# Patient Record
Sex: Male | Born: 1940 | Race: White | Hispanic: No | Marital: Married | Smoking: Former smoker
Health system: Southern US, Community
[De-identification: ages and names within clinical notes are randomized; demographics above are authoritative.]

## PROBLEM LIST (undated history)

## (undated) DIAGNOSIS — R519 Headache, unspecified: Secondary | ICD-10-CM

## (undated) DIAGNOSIS — R0609 Other forms of dyspnea: Secondary | ICD-10-CM

## (undated) DIAGNOSIS — Z87442 Personal history of urinary calculi: Secondary | ICD-10-CM

## (undated) DIAGNOSIS — R51 Headache: Secondary | ICD-10-CM

## (undated) DIAGNOSIS — R0600 Dyspnea, unspecified: Secondary | ICD-10-CM

## (undated) DIAGNOSIS — M1990 Unspecified osteoarthritis, unspecified site: Secondary | ICD-10-CM

## (undated) DIAGNOSIS — N200 Calculus of kidney: Secondary | ICD-10-CM

## (undated) DIAGNOSIS — N400 Enlarged prostate without lower urinary tract symptoms: Secondary | ICD-10-CM

## (undated) DIAGNOSIS — F32A Depression, unspecified: Secondary | ICD-10-CM

## (undated) DIAGNOSIS — E782 Mixed hyperlipidemia: Secondary | ICD-10-CM

## (undated) DIAGNOSIS — F329 Major depressive disorder, single episode, unspecified: Secondary | ICD-10-CM

## (undated) DIAGNOSIS — I10 Essential (primary) hypertension: Secondary | ICD-10-CM

## (undated) DIAGNOSIS — F419 Anxiety disorder, unspecified: Secondary | ICD-10-CM

## (undated) DIAGNOSIS — C801 Malignant (primary) neoplasm, unspecified: Secondary | ICD-10-CM

## (undated) HISTORY — PX: FINGER TENDON REPAIR: SHX1640

## (undated) HISTORY — DX: Calculus of kidney: N20.0

## (undated) HISTORY — DX: Other forms of dyspnea: R06.09

## (undated) HISTORY — DX: Headache: R51

## (undated) HISTORY — DX: Mixed hyperlipidemia: E78.2

## (undated) HISTORY — DX: Dyspnea, unspecified: R06.00

## (undated) HISTORY — DX: Unspecified osteoarthritis, unspecified site: M19.90

## (undated) HISTORY — PX: UMBILICAL HERNIA REPAIR: SHX196

## (undated) HISTORY — DX: Headache, unspecified: R51.9

---

## 1998-09-22 ENCOUNTER — Ambulatory Visit (HOSPITAL_COMMUNITY): Admission: RE | Admit: 1998-09-22 | Discharge: 1998-09-22 | Payer: Self-pay | Admitting: Specialist

## 2016-07-06 ENCOUNTER — Ambulatory Visit (INDEPENDENT_AMBULATORY_CARE_PROVIDER_SITE_OTHER): Payer: Medicare HMO | Admitting: Otolaryngology

## 2016-07-06 DIAGNOSIS — H900 Conductive hearing loss, bilateral: Secondary | ICD-10-CM

## 2016-07-06 DIAGNOSIS — H6123 Impacted cerumen, bilateral: Secondary | ICD-10-CM

## 2017-01-04 ENCOUNTER — Ambulatory Visit (INDEPENDENT_AMBULATORY_CARE_PROVIDER_SITE_OTHER): Payer: Medicare HMO | Admitting: Otolaryngology

## 2017-01-04 DIAGNOSIS — H6123 Impacted cerumen, bilateral: Secondary | ICD-10-CM

## 2017-06-14 ENCOUNTER — Ambulatory Visit (INDEPENDENT_AMBULATORY_CARE_PROVIDER_SITE_OTHER): Payer: Medicare HMO | Admitting: Cardiovascular Disease

## 2017-06-14 ENCOUNTER — Encounter: Payer: Self-pay | Admitting: *Deleted

## 2017-06-14 ENCOUNTER — Encounter: Payer: Self-pay | Admitting: Cardiovascular Disease

## 2017-06-14 VITALS — BP 128/70 | HR 63 | Ht 66.0 in | Wt 211.0 lb

## 2017-06-14 DIAGNOSIS — R079 Chest pain, unspecified: Secondary | ICD-10-CM | POA: Diagnosis not present

## 2017-06-14 DIAGNOSIS — R0609 Other forms of dyspnea: Secondary | ICD-10-CM

## 2017-06-14 DIAGNOSIS — E785 Hyperlipidemia, unspecified: Secondary | ICD-10-CM | POA: Diagnosis not present

## 2017-06-14 DIAGNOSIS — I10 Essential (primary) hypertension: Secondary | ICD-10-CM | POA: Diagnosis not present

## 2017-06-14 NOTE — Progress Notes (Signed)
CARDIOLOGY CONSULT NOTE  Patient ID: Brian Cordova MRN: [id number] DOB/AGE: 1941/03/23 77 y.o.  Admit date: (Not on file) Primary Physician: Deloria Lair., MD Referring Physician: Dr. Scotty Court  Reason for Consultation: Exertional dyspnea  HPI: Brian Cordova is a 76 y.o. male who is being seen today for the evaluation of exertional dyspnea at the request of Deloria Lair., MD.   He has a history of hypertension and hyperlipidemia.  I personally reviewed all relevant documentation, labs, and studies provided by PCP.  Echocardiogram performed at an outside facility on 05/24/17 demonstrated normal left ventricular systolic function and regional wall motion, LVEF 60-65%, mild left atrial dilatation, trivial mitral regurgitation.  ECG performed in the office today which I ordered and personally interpreted demonstrated sinus rhythm with an incomplete right bundle-branch block and no significant ST segment or T-wave abnormalities.  For the past 3 months, he has been experiencing intermittent exertional dyspnea and chest pain. It only occurs with exertion. 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 He denies a decrease in energy levels over the past several months. He denies claudication pain. He quit smoking 35 years ago and used to smoke 2-3 packs per week for about 20 years. He denies orthopnea, leg swelling, and paroxysmal nocturnal dyspnea. He has occasional dizziness but denies syncope. He denies palpitations.       No Known Allergies  Current Outpatient Prescriptions  Medication Sig Dispense Refill  . finasteride (PROSCAR) 5 MG tablet Take 5 mg by mouth daily.    Marland Kitchen losartan (COZAAR) 25 MG tablet Take 25 mg by mouth daily.    . simvastatin (ZOCOR) 40 MG tablet Take 40 mg by mouth daily.     No current facility-administered medications for this visit.     Past Medical  History:  Diagnosis Date  . DOE (dyspnea on exertion)   . Kidney stone   . Mixed hyperlipidemia   . Occipital headache   . Osteoarthritis     Past Surgical History:  Procedure Laterality Date  . UMBILICAL HERNIA REPAIR      Social History   Social History  . Marital status: Married    Spouse name: N/A  . Number of children: N/A  . Years of education: N/A   Occupational History  . Not on file.   Social History Main Topics  . Smoking status: Never Smoker  . Smokeless tobacco: Never Used  . Alcohol use Not on file  . Drug use: Unknown  . Sexual activity: Not on file   Other Topics Concern  . Not on file   Social History Narrative  . No narrative on file     No family history of premature CAD in 1st degree relatives.  Current Meds  Medication Sig  . finasteride (PROSCAR) 5 MG tablet Take 5 mg by mouth daily.  Marland Kitchen losartan (COZAAR) 25 MG tablet Take 25 mg by mouth daily.  . simvastatin (ZOCOR) 40 MG tablet Take 40 mg by mouth daily.      Review of systems complete and found to be negative unless listed above in HPI    Physical exam Blood pressure 128/70, pulse 63, height 5\' 6"  (1.676 m), weight 211 lb (95.7 kg), SpO2 98 %. General: NAD Neck: No JVD, no thyromegaly or thyroid nodule.  Lungs: Clear to auscultation bilaterally with normal respiratory effort. CV: Nondisplaced PMI. Regular rate and  rhythm, normal S1/S2, no S3/S4, no murmur.  No peripheral edema.  No carotid bruit.    Abdomen: Firm, nontender, no distention.  Skin: Intact without lesions or rashes.  Neurologic: Alert and oriented x 3.  Psych: Normal affect. Extremities: No clubbing or cyanosis.  HEENT: Normal.   ECG: Most recent ECG reviewed.   Labs: No results found for: K, BUN, CREATININE, ALT, TSH, HGB   Lipids: No results found for: LDLCALC, LDLDIRECT, CHOL, TRIG, HDL      ASSESSMENT AND PLAN:  1. Exertional dyspnea and chest pain: Risk factors for coronary artery disease include  hypertension and hyperlipidemia. I will proceed with a nuclear myocardial perfusion imaging study to evaluate for ischemic heart disease (exercise Myoview).  2. Hypertension: Controlled. No changes.  3. Hypercholesterolemia: Continue statin therapy.      Disposition: Follow up in 6 weeks.   Signed: Kate Sable, M.D., F.A.C.C.  06/14/2017, 8:46 AM

## 2017-06-14 NOTE — Patient Instructions (Signed)
Medication Instructions:  Continue all current medications.  Labwork: none  Testing/Procedures:  Your physician has requested that you have a lexiscan myoview. For further information please visit [email or web address]. Please follow instruction sheet, as given.  Office will contact with results via phone or letter.    Follow-Up: 6 weeks   Any Other Special Instructions Will Be Listed Below (If Applicable).  If you need a refill on your cardiac medications before your next appointment, please call your pharmacy.  

## 2017-06-25 ENCOUNTER — Encounter (HOSPITAL_COMMUNITY)
Admission: RE | Admit: 2017-06-25 | Discharge: 2017-06-25 | Disposition: A | Discharge status: Home / Self Care | Payer: Medicare HMO | Source: Ambulatory Visit | Attending: Cardiovascular Disease | Admitting: Cardiovascular Disease

## 2017-06-25 ENCOUNTER — Encounter (HOSPITAL_BASED_OUTPATIENT_CLINIC_OR_DEPARTMENT_OTHER)
Admission: RE | Admit: 2017-06-25 | Discharge: 2017-06-25 | Disposition: A | Discharge status: Home / Self Care | Payer: Medicare HMO | Source: Ambulatory Visit | Attending: Cardiovascular Disease | Admitting: Cardiovascular Disease

## 2017-06-25 ENCOUNTER — Encounter (HOSPITAL_COMMUNITY): Payer: Self-pay

## 2017-06-25 DIAGNOSIS — R079 Chest pain, unspecified: Secondary | ICD-10-CM

## 2017-06-25 DIAGNOSIS — R0609 Other forms of dyspnea: Secondary | ICD-10-CM

## 2017-06-25 HISTORY — DX: Essential (primary) hypertension: I10

## 2017-06-25 HISTORY — DX: Malignant (primary) neoplasm, unspecified: C80.1

## 2017-06-25 LAB — NM MYOCAR MULTI W/SPECT W/WALL MOTION / EF
CHL CUP NUCLEAR SSS: 0
CHL RATE OF PERCEIVED EXERTION: 14
CSEPEDS: 10 s
CSEPHR: 89 %
Estimated workload: 6.4 METS
Exercise duration (min): 6 min
LV sys vol: 40 mL
LVDIAVOL: 91 mL (ref 62–150)
MPHR: 146 {beats}/min
Peak HR: 131 {beats}/min
RATE: 0.32
Rest HR: 64 {beats}/min
SDS: 0
SRS: 0
TID: 0.93

## 2017-06-25 MED ORDER — REGADENOSON 0.4 MG/5ML IV SOLN
INTRAVENOUS | Status: AC
  Filled 2017-06-25: qty 5

## 2017-06-25 MED ORDER — SODIUM CHLORIDE 0.9% FLUSH
INTRAVENOUS | Status: AC
  Administered 2017-06-25: 10 mL via INTRAVENOUS
  Filled 2017-06-25: qty 10

## 2017-06-25 MED ORDER — TECHNETIUM TC 99M TETROFOSMIN IV KIT
10.0000 | PACK | Freq: Once | INTRAVENOUS | Status: AC | PRN
  Administered 2017-06-25: 11 via INTRAVENOUS

## 2017-06-25 MED ORDER — TECHNETIUM TC 99M TETROFOSMIN IV KIT
30.0000 | PACK | Freq: Once | INTRAVENOUS | Status: AC | PRN
  Administered 2017-06-25: 32 via INTRAVENOUS

## 2017-06-27 ENCOUNTER — Telehealth: Payer: Self-pay | Admitting: *Deleted

## 2017-06-27 NOTE — Telephone Encounter (Signed)
Notes recorded by Laurine Blazer, LPN on 15/83/0940 at 2:16 PM EDT Patient notified. Copy to pmd. Follow up 07/20/2017. ------  Notes recorded by Herminio Commons, MD on 06/25/2017 at 4:59 PM EDT Low risk. No apparent blockages.

## 2017-07-05 ENCOUNTER — Ambulatory Visit (INDEPENDENT_AMBULATORY_CARE_PROVIDER_SITE_OTHER): Payer: Medicare HMO | Admitting: Otolaryngology

## 2017-07-05 DIAGNOSIS — H6123 Impacted cerumen, bilateral: Secondary | ICD-10-CM

## 2017-07-18 ENCOUNTER — Telehealth: Payer: Self-pay | Admitting: Cardiovascular Disease

## 2017-07-18 NOTE — Telephone Encounter (Signed)
Patient called to cancel his appointment with Dr. Bronson Ing on Friday 07-20-17. States that he does not want to reschedule at this time.

## 2017-07-18 NOTE — Telephone Encounter (Signed)
Will let Dr. Bronson Ing know

## 2017-07-20 ENCOUNTER — Ambulatory Visit: Payer: Medicare HMO | Admitting: Cardiovascular Disease

## 2018-01-03 ENCOUNTER — Ambulatory Visit (INDEPENDENT_AMBULATORY_CARE_PROVIDER_SITE_OTHER): Payer: Medicare HMO | Admitting: Otolaryngology

## 2018-01-03 DIAGNOSIS — H6123 Impacted cerumen, bilateral: Secondary | ICD-10-CM

## 2018-01-11 DIAGNOSIS — N529 Male erectile dysfunction, unspecified: Secondary | ICD-10-CM | POA: Diagnosis not present

## 2018-01-11 DIAGNOSIS — Z6833 Body mass index (BMI) 33.0-33.9, adult: Secondary | ICD-10-CM | POA: Diagnosis not present

## 2018-01-11 DIAGNOSIS — E782 Mixed hyperlipidemia: Secondary | ICD-10-CM | POA: Diagnosis not present

## 2018-01-11 DIAGNOSIS — N400 Enlarged prostate without lower urinary tract symptoms: Secondary | ICD-10-CM | POA: Diagnosis not present

## 2018-01-11 DIAGNOSIS — I10 Essential (primary) hypertension: Secondary | ICD-10-CM | POA: Diagnosis not present

## 2018-01-28 DIAGNOSIS — N529 Male erectile dysfunction, unspecified: Secondary | ICD-10-CM | POA: Diagnosis not present

## 2018-01-30 DIAGNOSIS — Z6832 Body mass index (BMI) 32.0-32.9, adult: Secondary | ICD-10-CM | POA: Diagnosis not present

## 2018-01-30 DIAGNOSIS — F331 Major depressive disorder, recurrent, moderate: Secondary | ICD-10-CM | POA: Diagnosis not present

## 2018-02-28 DIAGNOSIS — N529 Male erectile dysfunction, unspecified: Secondary | ICD-10-CM | POA: Diagnosis not present

## 2018-03-05 DIAGNOSIS — M25521 Pain in right elbow: Secondary | ICD-10-CM | POA: Diagnosis not present

## 2018-03-05 DIAGNOSIS — Z6833 Body mass index (BMI) 33.0-33.9, adult: Secondary | ICD-10-CM | POA: Diagnosis not present

## 2018-03-05 DIAGNOSIS — F331 Major depressive disorder, recurrent, moderate: Secondary | ICD-10-CM | POA: Diagnosis not present

## 2018-03-05 DIAGNOSIS — E782 Mixed hyperlipidemia: Secondary | ICD-10-CM | POA: Diagnosis not present

## 2018-03-05 DIAGNOSIS — I10 Essential (primary) hypertension: Secondary | ICD-10-CM | POA: Diagnosis not present

## 2018-03-05 DIAGNOSIS — N400 Enlarged prostate without lower urinary tract symptoms: Secondary | ICD-10-CM | POA: Diagnosis not present

## 2018-03-05 DIAGNOSIS — N529 Male erectile dysfunction, unspecified: Secondary | ICD-10-CM | POA: Diagnosis not present

## 2018-03-19 DIAGNOSIS — H524 Presbyopia: Secondary | ICD-10-CM | POA: Diagnosis not present

## 2018-03-25 DIAGNOSIS — L821 Other seborrheic keratosis: Secondary | ICD-10-CM | POA: Diagnosis not present

## 2018-03-25 DIAGNOSIS — Z85820 Personal history of malignant melanoma of skin: Secondary | ICD-10-CM | POA: Diagnosis not present

## 2018-03-25 DIAGNOSIS — L570 Actinic keratosis: Secondary | ICD-10-CM | POA: Diagnosis not present

## 2018-03-25 DIAGNOSIS — Z85828 Personal history of other malignant neoplasm of skin: Secondary | ICD-10-CM | POA: Diagnosis not present

## 2018-03-28 DIAGNOSIS — N529 Male erectile dysfunction, unspecified: Secondary | ICD-10-CM | POA: Diagnosis not present

## 2018-04-26 DIAGNOSIS — N529 Male erectile dysfunction, unspecified: Secondary | ICD-10-CM | POA: Diagnosis not present

## 2018-05-15 ENCOUNTER — Ambulatory Visit: Payer: Medicare HMO | Admitting: Urology

## 2018-05-15 DIAGNOSIS — N401 Enlarged prostate with lower urinary tract symptoms: Secondary | ICD-10-CM | POA: Diagnosis not present

## 2018-05-15 DIAGNOSIS — N5201 Erectile dysfunction due to arterial insufficiency: Secondary | ICD-10-CM | POA: Diagnosis not present

## 2018-05-24 DIAGNOSIS — N529 Male erectile dysfunction, unspecified: Secondary | ICD-10-CM | POA: Diagnosis not present

## 2018-05-24 DIAGNOSIS — Z23 Encounter for immunization: Secondary | ICD-10-CM | POA: Diagnosis not present

## 2018-06-11 IMAGING — NM NM MYOCAR MULTI W/SPECT W/WALL MOTION & EF
2 series · 12 of 12 positions shown · non-contrast
Comparison: none

[Series 1: rest · 6.51mm/px · 6 of 64 frames shown]
[frame 6/64]
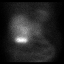
[frame 16/64]
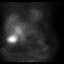
[frame 27/64]
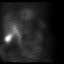
[frame 38/64]
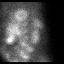
[frame 48/64]
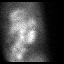
[frame 59/64]
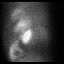

[Series 2: stress gated · 6.51mm/px · 6 of 64 frames shown]
[frame 6/64]
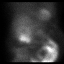
[frame 16/64]
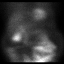
[frame 27/64]
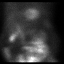
[frame 38/64]
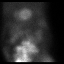
[frame 48/64]
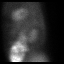
[frame 59/64]
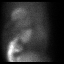

[12 of 12 positions shown; findings below may reference images not displayed]

Canned report from images found in remote index.

Refer to host system for actual result text.

## 2018-06-20 DIAGNOSIS — R350 Frequency of micturition: Secondary | ICD-10-CM | POA: Diagnosis not present

## 2018-06-20 DIAGNOSIS — N401 Enlarged prostate with lower urinary tract symptoms: Secondary | ICD-10-CM | POA: Diagnosis not present

## 2018-06-20 DIAGNOSIS — R3915 Urgency of urination: Secondary | ICD-10-CM | POA: Diagnosis not present

## 2018-06-24 DIAGNOSIS — N529 Male erectile dysfunction, unspecified: Secondary | ICD-10-CM | POA: Diagnosis not present

## 2018-06-26 ENCOUNTER — Ambulatory Visit: Payer: Medicare HMO | Admitting: Urology

## 2018-07-03 ENCOUNTER — Ambulatory Visit: Payer: Medicare HMO | Admitting: Urology

## 2018-07-03 DIAGNOSIS — N401 Enlarged prostate with lower urinary tract symptoms: Secondary | ICD-10-CM | POA: Diagnosis not present

## 2018-07-04 ENCOUNTER — Other Ambulatory Visit: Payer: Self-pay | Admitting: Urology

## 2018-07-04 ENCOUNTER — Ambulatory Visit (INDEPENDENT_AMBULATORY_CARE_PROVIDER_SITE_OTHER): Payer: Medicare HMO | Admitting: Otolaryngology

## 2018-07-04 DIAGNOSIS — H6123 Impacted cerumen, bilateral: Secondary | ICD-10-CM | POA: Diagnosis not present

## 2018-07-18 NOTE — Patient Instructions (Signed)
Brian Cordova  07/18/2018     @PREFPERIOPPHARMACY @   Your procedure is scheduled on  07/29/2018.  Report to [facility] at  645  A.M.  Call this number if you have problems the morning of surgery:  [phone]   Remember:  Do not eat or drink after midnight.                        Take these medicines the morning of surgery with A SIP OF WATER  Proscar, losartan.    Do not wear jewelry, make-up or nail polish.  Do not wear lotions, powders, or perfumes, or deodorant.  Do not shave 48 hours prior to surgery.  Men may shave face and neck.  Do not bring valuables to the hospital.  [facility] is not responsible for any belongings or valuables.  Contacts, dentures or bridgework may not be worn into surgery.  Leave your suitcase in the car.  After surgery it may be brought to your room.  For patients admitted to the hospital, discharge time will be determined by your treatment team.  Patients discharged the day of surgery will not be allowed to drive home.   Name and phone number of your driver:   family Special instructions:  None  Please read over the following fact sheets that you were given. Anesthesia Post-op Instructions and Care and Recovery After Surgery       Transurethral Resection of the Prostate Transurethral resection of the prostate (TURP) is the removal (resection) of part of the gland that produces semen (prostate gland). This procedure is done to treat benign prostatic hyperplasia (BPH). BPH is an abnormal, noncancerous (benign) increase in the number of cells that make up the prostate tissue. BPH causes the prostate to get bigger. The enlarged prostate can push against or block the tube that drains urine from the bladder out of the body (urethra). BPH can affect normal urine flow by causing bladder infections, difficulty controlling bladder function, and difficulty emptying the bladder. The goal of TURP is to remove enough prostate tissue to  allow for a normal flow of urine. In a transurethral resection, a thin telescope with a light, a tiny camera, and an electric cutting edge (resectoscope) is passed through the urethra and into the prostate. The opening of the urethra is at the end of the penis. Tell a health care provider about:  Any allergies you have.  All medicines you are taking, including vitamins, herbs, eye drops, creams, and over-the-counter medicines.  Any problems you or family members have had with anesthetic medicines.  Any blood disorders you have.  Any surgeries you have had.  Any medical conditions you have.  Any prostate infections you have had. What are the risks? Generally, this is a safe procedure. However, problems may occur, including:  Infection.  Bleeding.  Allergic reactions to medicines.  Damage to other structures or organs, such as: ? The urethra. ? The bladder. ? Muscles that surround the prostate.  Difficulty getting an erection.  Difficulty controlling urination (incontinence).  Scarring, which may cause problems with urine flow.  What happens before the procedure?  Follow instructions from your health care provider about eating or drinking restrictions.  Ask your health care provider about: ? Changing or stopping your regular medicines. This is especially important if you are taking diabetes medicines or blood thinners. ? Taking medicines such as aspirin and ibuprofen. These  medicines can thin your blood. Do not take these medicines before your procedure if your health care provider instructs you not to.  You may have a physical exam.  You may have a blood or urine sample taken.  You may be given antibiotic medicine to help prevent infection.  Ask your health care provider how your surgical site will be marked or identified.  Plan to have someone take you home after the procedure. You may not be able to drive for up to 10 days after your procedure. What happens  during the procedure?  To reduce your risk of infection: ? Your health care team will wash or sanitize their hands. ? Your skin will be washed with soap.  An IV tube will be inserted into one of your veins.  You will be given one or more of the following: ? A medicine to help you relax (sedative). ? A medicine to make you fall asleep (general anesthetic). ? A medicine that is injected into your spine to numb the area below and slightly above the injection site (spinal anesthetic).  Your legs will be placed in foot rests (stirrups) so that your legs are apart and your knees are bent.  The resectoscope will be passed through your urethra to your prostate.  Parts of your prostate will be resected using the cutting edge of the resectoscope.  The resectoscope will be removed.  A thin, flexible tube (catheter) will be passed through your urethra and into your bladder. The catheter will drain urine into a bag outside of your body. ? Fluid may be passed through the catheter to keep the catheter open. The procedure may vary among health care providers and hospitals. What happens after the procedure?  Your blood pressure, heart rate, breathing rate, and blood oxygen level will be monitored often until the medicines you were given have worn off.  You may continue to receive fluids and medicines through an IV tube.  You may have some pain. Pain medicine will be available to help you.  You will have a catheter draining your urine. ? You may have blood in your urine. Your catheter may be kept in until your urine is clear. ? Your urinary drainage will be monitored. If necessary, your bladder may be rinsed out (irrigated) through your catheter.  You will be encouraged to walk around as soon as possible.  You may have to wear compression stockings. These stockings help prevent blood clots and reduce swelling in your legs.  Do not drive for 24 hours if you received a sedative. This information  is not intended to replace advice given to you by your health care provider. Make sure you discuss any questions you have with your health care provider. Document Released: 08/28/2005 Document Revised: 04/30/2016 Document Reviewed: 05/20/2015 Elsevier Interactive Patient Education  2018 [employer].  Transurethral Resection of the Prostate, Care After Refer to this sheet in the next few weeks. These instructions provide you with information about caring for yourself after your procedure. Your health care provider may also give you more specific instructions. Your treatment has been planned according to current medical practices, but problems sometimes occur. Call your health care provider if you have any problems or questions after your procedure. What can I expect after the procedure? After the procedure, it is common to have:  Mild pain in your lower abdomen.  Soreness or mild discomfort in your penis from having the catheter inserted during the procedure.  A feeling of urgency when  you need to urinate.  A small amount of blood in your urine. You may notice some small blood clots in your urine. These are normal.  Follow these instructions at home: Medicines   Take over-the-counter and prescription medicines only as told by your health care provider.  Do not drive or operate heavy machinery while taking prescription pain medicine.  Do not drive for 24 hours if you received a sedative.  If you were prescribed antibiotic medicine, take it as told by your health care provider. Do not stop taking the antibiotic even if you start to feel better. Activity  Return to your normal activities as told by your health care provider. Ask your health care provider what activities are safe for you.  Do not lift anything that is heavier than 10 lb (4.5 kg) for 3 weeks after your procedure, or as long as told by your health care provider.  Avoid intense physical activity for as long as told by  your health care provider.  Walk at least one time every day. This helps to prevent blood clots. You may increase your physical activity gradually as you start to feel better. Lifestyle  Do not drink alcohol for as long as told by your health care provider. This is especially important if you are taking prescription pain medicines.  Do not engage in sexual activity until your health care provider says that you can do this. General instructions  Do not take baths, swim, or use a hot tub until your health care provider approves.  Drink enough fluid to keep your urine clear or pale yellow.  Urinate as soon as you feel the need to. Do not try to hold your urine for long periods of time.  If your health care provider approves, you may take a stool softener for 2-3 weeks to prevent you from straining to have a bowel movement.  Wear compression stockings as told by your health care provider. These stockings help to prevent blood clots and reduce swelling in your legs.  Keep all follow-up visits as told by your health care provider. This is important. Contact a health care provider if:  You have difficulty urinating.  You have a fever.  You have pain that gets worse or does not improve with medicine.  You have blood in your urine that does not go away after 1 week of resting and drinking more fluids.  You have swelling in your penis or testicles. Get help right away if:  You are unable to urinate.  You are having more blood clots in your urine instead of fewer.  You have: ? Large blood clots. ? A lot of blood in your urine. ? Pain in your back or lower abdomen. ? Pain or swelling in your legs. ? Chills and you are shaking. This information is not intended to replace advice given to you by your health care provider. Make sure you discuss any questions you have with your health care provider. Document Released: 08/28/2005 Document Revised: 04/30/2016 Document Reviewed:  05/20/2015 Elsevier Interactive Patient Education  2017 [place] Anesthesia, Adult General anesthesia is the use of medicines to make a person "go to sleep" (be unconscious) for a medical procedure. General anesthesia is often recommended when a procedure:  Is long.  Requires you to be still or in an unusual position.  Is major and can cause you to lose blood.  Is impossible to do without general anesthesia.  The medicines used for general anesthesia are  called general anesthetics. In addition to making you sleep, the medicines:  Prevent pain.  Control your blood pressure.  Relax your muscles.  Tell a health care provider about:  Any allergies you have.  All medicines you are taking, including vitamins, herbs, eye drops, creams, and over-the-counter medicines.  Any problems you or family members have had with anesthetic medicines.  Types of anesthetics you have had in the past.  Any bleeding disorders you have.  Any surgeries you have had.  Any medical conditions you have.  Any history of heart or lung conditions, such as heart failure, sleep apnea, or chronic obstructive pulmonary disease (COPD).  Whether you are pregnant or may be pregnant.  Whether you use tobacco, alcohol, marijuana, or street drugs.  Any history of Armed forces logistics/support/administrative officer.  Any history of depression or anxiety. What are the risks? Generally, this is a safe procedure. However, problems may occur, including:  Allergic reaction to anesthetics.  Lung and heart problems.  Inhaling food or liquids from your stomach into your lungs (aspiration).  Injury to nerves.  Waking up during your procedure and being unable to move (rare).  Extreme agitation or a state of mental confusion (delirium) when you wake up from the anesthetic.  Air in the bloodstream, which can lead to stroke.  These problems are more likely to develop if you are having a major surgery or if you have an advanced  medical condition. You can prevent some of these complications by answering all of your health care provider's questions thoroughly and by following all pre-procedure instructions. General anesthesia can cause side effects, including:  Nausea or vomiting  A sore throat from the breathing tube.  Feeling cold or shivery.  Feeling tired, washed out, or achy.  Sleepiness or drowsiness.  Confusion or agitation.  What happens before the procedure? Staying hydrated Follow instructions from your health care provider about hydration, which may include:  Up to 2 hours before the procedure - you may continue to drink clear liquids, such as water, clear fruit juice, black coffee, and plain tea.  Eating and drinking restrictions Follow instructions from your health care provider about eating and drinking, which may include:  8 hours before the procedure - stop eating heavy meals or foods such as meat, fried foods, or fatty foods.  6 hours before the procedure - stop eating light meals or foods, such as toast or cereal.  6 hours before the procedure - stop drinking milk or drinks that contain milk.  2 hours before the procedure - stop drinking clear liquids.  Medicines  Ask your health care provider about: ? Changing or stopping your regular medicines. This is especially important if you are taking diabetes medicines or blood thinners. ? Taking medicines such as aspirin and ibuprofen. These medicines can thin your blood. Do not take these medicines before your procedure if your health care provider instructs you not to. ? Taking new dietary supplements or medicines. Do not take these during the week before your procedure unless your health care provider approves them.  If you are told to take a medicine or to continue taking a medicine on the day of the procedure, take the medicine with sips of water. General instructions   Ask if you will be going home the same day, the following day,  or after a longer hospital stay. ? Plan to have someone take you home. ? Plan to have someone stay with you for the first 24 hours after you leave the  hospital or clinic.  For 3-6 weeks before the procedure, try not to use any tobacco products, such as cigarettes, chewing tobacco, and e-cigarettes.  You may brush your teeth on the morning of the procedure, but make sure to spit out the toothpaste. What happens during the procedure?  You will be given anesthetics through a mask and through an IV tube in one of your veins.  You may receive medicine to help you relax (sedative).  As soon as you are asleep, a breathing tube may be used to help you breathe.  An anesthesia specialist will stay with you throughout the procedure. He or she will help keep you comfortable and safe by continuing to give you medicines and adjusting the amount of medicine that you get. He or she will also watch your blood pressure, pulse, and oxygen levels to make sure that the anesthetics do not cause any problems.  If a breathing tube was used to help you breathe, it will be removed before you wake up. The procedure may vary among health care providers and hospitals. What happens after the procedure?  You will wake up, often slowly, after the procedure is complete, usually in a recovery area.  Your blood pressure, heart rate, breathing rate, and blood oxygen level will be monitored until the medicines you were given have worn off.  You may be given medicine to help you calm down if you feel anxious or agitated.  If you will be going home the same day, your health care provider may check to make sure you can stand, drink, and urinate.  Your health care providers will treat your pain and side effects before you go home.  Do not drive for 24 hours if you received a sedative.  You may: ? Feel nauseous and vomit. ? Have a sore throat. ? Have mental slowness. ? Feel cold or shivery. ? Feel sleepy. ? Feel  tired. ? Feel sore or achy, even in parts of your body where you did not have surgery. This information is not intended to replace advice given to you by your health care provider. Make sure you discuss any questions you have with your health care provider. Document Released: 12/05/2007 Document Revised: 02/08/2016 Document Reviewed: 08/12/2015 Elsevier Interactive Patient Education  2018 [place] Anesthesia, Adult, Care After These instructions provide you with information about caring for yourself after your procedure. Your health care provider may also give you more specific instructions. Your treatment has been planned according to current medical practices, but problems sometimes occur. Call your health care provider if you have any problems or questions after your procedure. What can I expect after the procedure? After the procedure, it is common to have:  Vomiting.  A sore throat.  Mental slowness.  It is common to feel:  Nauseous.  Cold or shivery.  Sleepy.  Tired.  Sore or achy, even in parts of your body where you did not have surgery.  Follow these instructions at home: For at least 24 hours after the procedure:  Do not: ? Participate in activities where you could fall or become injured. ? Drive. ? Use heavy machinery. ? Drink alcohol. ? Take sleeping pills or medicines that cause drowsiness. ? Make important decisions or sign legal documents. ? Take care of children on your own.  Rest. Eating and drinking  If you vomit, drink water, juice, or soup when you can drink without vomiting.  Drink enough fluid to keep your urine clear or pale yellow.  Make sure you have little or no nausea before eating solid foods.  Follow the diet recommended by your health care provider. General instructions  Have a responsible adult stay with you until you are awake and alert.  Return to your normal activities as told by your health care provider. Ask your  health care provider what activities are safe for you.  Take over-the-counter and prescription medicines only as told by your health care provider.  If you smoke, do not smoke without supervision.  Keep all follow-up visits as told by your health care provider. This is important. Contact a health care provider if:  You continue to have nausea or vomiting at home, and medicines are not helpful.  You cannot drink fluids or start eating again.  You cannot urinate after 8-12 hours.  You develop a skin rash.  You have fever.  You have increasing redness at the site of your procedure. Get help right away if:  You have difficulty breathing.  You have chest pain.  You have unexpected bleeding.  You feel that you are having a life-threatening or urgent problem. This information is not intended to replace advice given to you by your health care provider. Make sure you discuss any questions you have with your health care provider. Document Released: 12/04/2000 Document Revised: 01/31/2016 Document Reviewed: 08/12/2015 Elsevier Interactive Patient Education  [employer].

## 2018-07-24 ENCOUNTER — Encounter (HOSPITAL_COMMUNITY): Payer: Self-pay

## 2018-07-24 ENCOUNTER — Other Ambulatory Visit: Payer: Self-pay

## 2018-07-24 ENCOUNTER — Encounter (HOSPITAL_COMMUNITY)
Admission: RE | Admit: 2018-07-24 | Discharge: 2018-07-24 | Disposition: A | Discharge status: Home / Self Care | Payer: Medicare HMO | Source: Ambulatory Visit | Attending: Urology | Admitting: Urology

## 2018-07-24 DIAGNOSIS — Z01818 Encounter for other preprocedural examination: Secondary | ICD-10-CM | POA: Diagnosis not present

## 2018-07-24 HISTORY — DX: Personal history of urinary calculi: Z87.442

## 2018-07-24 HISTORY — DX: Anxiety disorder, unspecified: F41.9

## 2018-07-24 HISTORY — DX: Depression, unspecified: F32.A

## 2018-07-24 HISTORY — DX: Major depressive disorder, single episode, unspecified: F32.9

## 2018-07-24 HISTORY — DX: Benign prostatic hyperplasia without lower urinary tract symptoms: N40.0

## 2018-07-24 LAB — CBC WITH DIFFERENTIAL/PLATELET
Abs Immature Granulocytes: 0.03 10*3/uL (ref 0.00–0.07)
BASOS ABS: 0.1 10*3/uL (ref 0.0–0.1)
Basophils Relative: 1 %
Eosinophils Absolute: 0.4 10*3/uL (ref 0.0–0.5)
Eosinophils Relative: 4 %
HEMATOCRIT: 45.3 % (ref 39.0–52.0)
HEMOGLOBIN: 14.7 g/dL (ref 13.0–17.0)
IMMATURE GRANULOCYTES: 0 %
LYMPHS PCT: 34 %
Lymphs Abs: 3.5 10*3/uL (ref 0.7–4.0)
MCH: 31.7 pg (ref 26.0–34.0)
MCHC: 32.5 g/dL (ref 30.0–36.0)
MCV: 97.8 fL (ref 80.0–100.0)
MONOS PCT: 12 %
Monocytes Absolute: 1.2 10*3/uL — ABNORMAL HIGH (ref 0.1–1.0)
NEUTROS PCT: 49 %
Neutro Abs: 5 10*3/uL (ref 1.7–7.7)
Platelets: 267 10*3/uL (ref 150–400)
RBC: 4.63 MIL/uL (ref 4.22–5.81)
RDW: 12.6 % (ref 11.5–15.5)
WBC: 10.1 10*3/uL (ref 4.0–10.5)
nRBC: 0 % (ref 0.0–0.2)

## 2018-07-24 LAB — BASIC METABOLIC PANEL
Anion gap: 8 (ref 5–15)
BUN: 17 mg/dL (ref 8–23)
CHLORIDE: 105 mmol/L (ref 98–111)
CO2: 25 mmol/L (ref 22–32)
Calcium: 8.6 mg/dL — ABNORMAL LOW (ref 8.9–10.3)
Creatinine, Ser: 1.06 mg/dL (ref 0.61–1.24)
GFR calc non Af Amer: >60 mL/min (ref >60)
Glucose, Bld: 127 mg/dL — ABNORMAL HIGH (ref 70–99)
POTASSIUM: 4.3 mmol/L (ref 3.5–5.1)
Sodium: 138 mmol/L (ref 135–145)

## 2018-07-29 ENCOUNTER — Encounter (HOSPITAL_COMMUNITY)
Admission: RE | Disposition: A | Discharge status: Home / Self Care | Payer: Self-pay | Source: Ambulatory Visit | Attending: Urology

## 2018-07-29 ENCOUNTER — Ambulatory Visit (HOSPITAL_COMMUNITY): Payer: Medicare HMO | Admitting: Anesthesiology

## 2018-07-29 ENCOUNTER — Ambulatory Visit (HOSPITAL_COMMUNITY)
Admission: RE | Admit: 2018-07-29 | Discharge: 2018-07-29 | Disposition: A | Discharge status: Home / Self Care | Payer: Medicare HMO | Source: Ambulatory Visit | Attending: Urology | Admitting: Urology

## 2018-07-29 ENCOUNTER — Encounter (HOSPITAL_COMMUNITY): Payer: Self-pay | Admitting: *Deleted

## 2018-07-29 ENCOUNTER — Other Ambulatory Visit: Payer: Self-pay

## 2018-07-29 DIAGNOSIS — R3911 Hesitancy of micturition: Secondary | ICD-10-CM | POA: Diagnosis not present

## 2018-07-29 DIAGNOSIS — Z87891 Personal history of nicotine dependence: Secondary | ICD-10-CM | POA: Insufficient documentation

## 2018-07-29 DIAGNOSIS — I10 Essential (primary) hypertension: Secondary | ICD-10-CM | POA: Insufficient documentation

## 2018-07-29 DIAGNOSIS — N401 Enlarged prostate with lower urinary tract symptoms: Secondary | ICD-10-CM | POA: Diagnosis not present

## 2018-07-29 DIAGNOSIS — Z85828 Personal history of other malignant neoplasm of skin: Secondary | ICD-10-CM | POA: Diagnosis not present

## 2018-07-29 DIAGNOSIS — N400 Enlarged prostate without lower urinary tract symptoms: Secondary | ICD-10-CM | POA: Diagnosis not present

## 2018-07-29 DIAGNOSIS — N138 Other obstructive and reflux uropathy: Secondary | ICD-10-CM | POA: Diagnosis not present

## 2018-07-29 HISTORY — PX: TRANSURETHRAL RESECTION OF PROSTATE: SHX73

## 2018-07-29 SURGERY — TURP (TRANSURETHRAL RESECTION OF PROSTATE)
Anesthesia: General | Site: Prostate

## 2018-07-29 MED ORDER — LACTATED RINGERS IV SOLN
INTRAVENOUS | Status: DC
  Administered 2018-07-29 (×2): via INTRAVENOUS

## 2018-07-29 MED ORDER — SODIUM CHLORIDE 0.9 % IR SOLN
Status: DC | PRN
  Administered 2018-07-29 (×4): 3000 mL via INTRAVESICAL

## 2018-07-29 MED ORDER — ONDANSETRON HCL 4 MG/2ML IJ SOLN
INTRAMUSCULAR | Status: AC
  Filled 2018-07-29: qty 2

## 2018-07-29 MED ORDER — FENTANYL CITRATE (PF) 100 MCG/2ML IJ SOLN
INTRAMUSCULAR | Status: DC | PRN
  Administered 2018-07-29: 25 ug via INTRAVENOUS

## 2018-07-29 MED ORDER — WATER FOR IRRIGATION, STERILE IR SOLN
Status: DC | PRN
  Administered 2018-07-29: 1000 mL

## 2018-07-29 MED ORDER — MIDAZOLAM HCL 5 MG/5ML IJ SOLN
INTRAMUSCULAR | Status: DC | PRN
  Administered 2018-07-29: 2 mg via INTRAVENOUS

## 2018-07-29 MED ORDER — PROMETHAZINE HCL 25 MG/ML IJ SOLN: 6.2500 mg | INTRAMUSCULAR | Status: DC | PRN

## 2018-07-29 MED ORDER — PROPOFOL 10 MG/ML IV BOLUS
INTRAVENOUS | Status: AC
  Filled 2018-07-29: qty 40

## 2018-07-29 MED ORDER — ONDANSETRON HCL 4 MG/2ML IJ SOLN
INTRAMUSCULAR | Status: DC | PRN
  Administered 2018-07-29: 4 mg via INTRAVENOUS

## 2018-07-29 MED ORDER — HYDROMORPHONE HCL 1 MG/ML IJ SOLN
INTRAMUSCULAR | Status: AC
  Filled 2018-07-29: qty 0.5

## 2018-07-29 MED ORDER — OXYCODONE-ACETAMINOPHEN 5-325 MG PO TABS: 1.0000 | ORAL_TABLET | ORAL | 0 refills | Status: AC | PRN

## 2018-07-29 MED ORDER — SODIUM CHLORIDE 0.9 % IV SOLN
2.0000 g | INTRAVENOUS | Status: AC
  Administered 2018-07-29: 2 g via INTRAVENOUS

## 2018-07-29 MED ORDER — FENTANYL CITRATE (PF) 100 MCG/2ML IJ SOLN
INTRAMUSCULAR | Status: AC
  Filled 2018-07-29: qty 2

## 2018-07-29 MED ORDER — LIDOCAINE HCL 1 % IJ SOLN
INTRAMUSCULAR | Status: DC | PRN
  Administered 2018-07-29: 60 mg via INTRADERMAL

## 2018-07-29 MED ORDER — PHENYLEPHRINE HCL 10 MG/ML IJ SOLN
INTRAMUSCULAR | Status: DC | PRN
  Administered 2018-07-29: 40 ug via INTRAVENOUS

## 2018-07-29 MED ORDER — MIDAZOLAM HCL 2 MG/2ML IJ SOLN
INTRAMUSCULAR | Status: AC
  Filled 2018-07-29: qty 2

## 2018-07-29 MED ORDER — HYDROCODONE-ACETAMINOPHEN 7.5-325 MG PO TABS: 1.0000 | ORAL_TABLET | Freq: Once | ORAL | Status: DC | PRN

## 2018-07-29 MED ORDER — MIDAZOLAM HCL 2 MG/2ML IJ SOLN: 0.5000 mg | Freq: Once | INTRAMUSCULAR | Status: DC | PRN

## 2018-07-29 MED ORDER — PROPOFOL 10 MG/ML IV BOLUS
INTRAVENOUS | Status: DC | PRN
  Administered 2018-07-29: 150 mg via INTRAVENOUS
  Administered 2018-07-29: 20 mg via INTRAVENOUS

## 2018-07-29 MED ORDER — SODIUM CHLORIDE 0.9 % IV SOLN
INTRAVENOUS | Status: AC
  Filled 2018-07-29: qty 20

## 2018-07-29 MED ORDER — HYDROMORPHONE HCL 1 MG/ML IJ SOLN
0.2500 mg | INTRAMUSCULAR | Status: DC | PRN
  Administered 2018-07-29 (×3): 0.5 mg via INTRAVENOUS
  Filled 2018-07-29 (×2): qty 0.5

## 2018-07-29 SURGICAL SUPPLY — 24 items
BAG DRAIN URO TABLE W/ADPT NS (BAG) ×3 IMPLANT
BAG DRN 8 ADPR NS SKTRN CSTL (BAG) ×1
BAG DRN URN TUBE DRIP CHMBR (OSTOMY) ×1
BAG URINE DRAIN TURP 4L (OSTOMY) ×3 IMPLANT
CATH FOLEY 3WAY 30CC 22F (CATHETERS) ×2 IMPLANT
CLOTH BEACON ORANGE TIMEOUT ST (SAFETY) ×3 IMPLANT
ELECT REM PT RETURN 9FT ADLT (ELECTROSURGICAL) ×3
ELECTRODE REM PT RTRN 9FT ADLT (ELECTROSURGICAL) ×1 IMPLANT
GLOVE BIO SURGEON STRL SZ7 (GLOVE) ×2 IMPLANT
GLOVE BIO SURGEON STRL SZ8 (GLOVE) ×3 IMPLANT
GLOVE BIOGEL PI IND STRL 7.0 (GLOVE) IMPLANT
GLOVE BIOGEL PI INDICATOR 7.0 (GLOVE) ×4
GOWN STRL REUS W/ TWL XL LVL3 (GOWN DISPOSABLE) ×1 IMPLANT
GOWN STRL REUS W/TWL LRG LVL3 (GOWN DISPOSABLE) ×3 IMPLANT
GOWN STRL REUS W/TWL XL LVL3 (GOWN DISPOSABLE) ×3
IV NS IRRIG 3000ML ARTHROMATIC (IV SOLUTION) ×14 IMPLANT
KIT TURNOVER CYSTO (KITS) ×3 IMPLANT
LOOP CUT BIPOLAR 24F LRG (ELECTROSURGICAL) ×3 IMPLANT
MANIFOLD NEPTUNE II (INSTRUMENTS) ×2 IMPLANT
PACK CYSTO (CUSTOM PROCEDURE TRAY) ×3 IMPLANT
PAD ARMBOARD 7.5X6 YLW CONV (MISCELLANEOUS) ×3 IMPLANT
SYR 30ML LL (SYRINGE) ×2 IMPLANT
SYRINGE IRR TOOMEY STRL 70CC (SYRINGE) ×2 IMPLANT
WATER STERILE IRR 1000ML POUR (IV SOLUTION) ×3 IMPLANT

## 2018-07-29 NOTE — Anesthesia Procedure Notes (Signed)
Procedure Name: LMA Insertion Date/Time: 07/29/2018 8:16 AM Performed by: Charmaine Downs, CRNA Pre-anesthesia Checklist: Patient identified, Patient being monitored, Emergency Drugs available, Timeout performed and Suction available Patient Re-evaluated:Patient Re-evaluated prior to induction Oxygen Delivery Method: Circle System Utilized Preoxygenation: Pre-oxygenation with 100% oxygen Induction Type: IV induction Ventilation: Mask ventilation without difficulty LMA: LMA inserted LMA Size: 4.0 Number of attempts: 1 Placement Confirmation: positive ETCO2 and breath sounds checked- equal and bilateral Tube secured with: Tape Dental Injury: Teeth and Oropharynx as per pre-operative assessment

## 2018-07-29 NOTE — Progress Notes (Signed)
[facility]BI  D/C as ordered. Foley draining clear urine.

## 2018-07-29 NOTE — Progress Notes (Signed)
Dr Alyson Ingles at bedside to check pt. Foley draining clear light yellow urine. Order given to d/c pt home.

## 2018-07-29 NOTE — Op Note (Addendum)
Preoperative diagnosis: BPH  Postoperative diagnosis: BPH  Procedure: 1 cystoscopy 2. Transurethral resection of the prostate  Attending: [name]  Anesthesia: General  Estimated blood loss: Minimal  Drains: 22 French foley  Specimens: 1. Prostate Chips  Antibiotics: Rocephin  Findings: Trilobar prostate enlargement. Significant residual prostate tissue. Ureteral orifices in normal anatomic location.   Indications: Patient is a 77 year old male with a history of BPH and elevated PVR after TURP. He underwent ofice cystoscopy which showed a large amount of obstructing residual prostate tissue.  After discussing treatment options, they decided proceed with transurethral resection of the prostate.  Procedure her in detail: The patient was brought to the operating room and a brief timeout was done to ensure correct patient, correct procedure, correct site.  General anesthesia was administered patient was placed in dorsal lithotomy position.  Their genitalia was then prepped and draped in usual sterile fashion.  A rigid 27 French cystoscope was passed in the urethra and the bladder.  Bladder was inspected and we noted no masses or lesions.  the ureteral orifices were in the normal orthotopic locations. removed the cystoscope and placed a resectoscope into the bladder. We then turned our attention to the prostate resection. Using the bipolar resectoscope we resected the median lobe first from the bladder neck to the verumontanum. We then started at the 12 oclock position on the left lobe and resection to the 6 o'clock position from the bladder neck to the verumontanum. We then did the same resection of the right lobe. Once the resection was complete we then cauterized individual bleeders. We then removed the prostate chips and sent them for pathology.  We then re-inspected the prostatic fossa and found no residual bleeding.  the bladder was then drained, a 22 French foley was placed and this  concluded the procedure which was well tolerated by patient.  Complications: None  Condition: Stable, extubated, transferred to PACU  Plan: Patient is to be discharged home and followup in 5 days for foley catheter removal and pathology discussion.

## 2018-07-29 NOTE — Anesthesia Postprocedure Evaluation (Signed)
Anesthesia Post Note  Patient: Brian Cordova  Procedure(s) Performed: TRANSURETHRAL RESECTION OF THE PROSTATE (TURP) (N/A Prostate)  Patient location during evaluation: PACU Anesthesia Type: General Level of consciousness: awake and patient cooperative Pain management: pain level controlled Vital Signs Assessment: post-procedure vital signs reviewed and stable Respiratory status: spontaneous breathing, nonlabored ventilation and respiratory function stable Cardiovascular status: blood pressure returned to baseline Postop Assessment: no apparent nausea or vomiting Anesthetic complications: no     Last Vitals:  Vitals:   07/29/18 0917 07/29/18 0930  BP: (!) 155/94 (!) 151/95  Pulse: 67 (!) 58  Resp: 16 12  Temp: 36.7 C   SpO2: 95% 97%    Last Pain:  Vitals:   07/29/18 0917  TempSrc:   PainSc: (P) 2                  [name] J

## 2018-07-29 NOTE — Anesthesia Preprocedure Evaluation (Signed)
Anesthesia Evaluation    Airway Mallampati: II  TM Distance: >3 FB Neck ROM: Full    Dental no notable dental hx. (+) Poor Dentition, Missing   Pulmonary former smoker,    Pulmonary exam normal breath sounds clear to auscultation       Cardiovascular Exercise Tolerance: Good hypertension, Pt. on medications + DOE  Normal cardiovascular examII Rhythm:Regular Rate:Normal     Neuro/Psych  Headaches, Anxiety Depression    GI/Hepatic   Endo/Other    Renal/GU Renal disease     Musculoskeletal  (+) Arthritis , Osteoarthritis,    Abdominal   Peds  Hematology   Anesthesia Other Findings   Reproductive/Obstetrics                             Anesthesia Physical Anesthesia Plan  ASA: II  Anesthesia Plan: General   Post-op Pain Management:    Induction: Intravenous  PONV Risk Score and Plan:   Airway Management Planned: LMA  Additional Equipment:   Intra-op Plan:   Post-operative Plan: Extubation in OR  Informed Consent: I have reviewed the patients History and Physical, chart, labs and discussed the procedure including the risks, benefits and alternatives for the proposed anesthesia with the patient or authorized representative who has indicated his/her understanding and acceptance.   Dental advisory given  Plan Discussed with: CRNA  Anesthesia Plan Comments: (LMA vs ETT)        Anesthesia Quick Evaluation

## 2018-07-29 NOTE — H&P (Signed)
Urology Admission H&P  Chief Complaint: urinary hesitancy  History of Present Illness: Brian Cordova is a 77yo with a hx of BPH who has persistent severe LUTS after TURP. It has been refractory to medical therapy. Office cystoscopy shows residual prostate tissue that is obstructing. No fevers/chills/sweats  Past Medical History:  Diagnosis Date  . Anxiety   . BPH (benign prostatic hyperplasia)   . Cancer (HCC)    Skin  . Depression   . DOE (dyspnea on exertion)   . History of kidney stones   . Hypertension   . Kidney stone   . Mixed hyperlipidemia   . Occipital headache   . Osteoarthritis    Past Surgical History:  Procedure Laterality Date  . FINGER TENDON REPAIR Left    ring and pinky finger  . UMBILICAL HERNIA REPAIR      Home Medications:  Current Facility-Administered Medications  Medication Dose Route Frequency Provider Last Rate Last Dose  . cefTRIAXone (ROCEPHIN) 2 g in sodium chloride 0.9 % 100 mL IVPB  2 g Intravenous 30 min Pre-Op Carsen Leaf, Candee Furbish, MD      . lactated ringers infusion   Intravenous Continuous Hilaria Ota Talbert Forest, MD       Allergies: No Known Allergies  Family History  Problem Relation Age of Onset  . Pancreatic cancer Mother   . Heart disease Father    Social History:  reports that he quit smoking about 44 years ago. His smoking use included cigarettes. He has a 10.00 pack-year smoking history. He has never used smokeless tobacco. He reports that he drank alcohol. He reports that he does not use drugs.  Review of Systems  Genitourinary: Positive for frequency and urgency.  All other systems reviewed and are negative.   Physical Exam:  Vital signs in last 24 hours: Temp:  [97.8 F (36.6 C)] 97.8 F (36.6 C) (11/18 0715) Pulse Rate:  [69] 69 (11/18 0715) Resp:  [15] 15 (11/18 0715) BP: (167)/(93) 167/93 (11/18 0715) SpO2:  [98 %] 98 % (11/18 0715) Weight:  [97.5 kg] 97.5 kg (11/18 0715) Physical Exam  Constitutional: He is oriented to  person, place, and time. He appears well-developed and well-nourished.  HENT:  Head: Normocephalic and atraumatic.  Eyes: Pupils are equal, round, and reactive to light. EOM are normal.  Neck: Normal range of motion. No thyromegaly present.  Cardiovascular: Normal rate and regular rhythm.  Respiratory: Effort normal. No respiratory distress.  GI: Soft. He exhibits no distension.  Musculoskeletal: Normal range of motion. He exhibits no edema.  Neurological: He is alert and oriented to person, place, and time.  Skin: Skin is warm and dry.  Psychiatric: He has a normal mood and affect. His behavior is normal. Judgment and thought content normal.    Laboratory Data:  No results found for this or any previous visit (from the past 24 hour(s)). No results found for this or any previous visit (from the past 240 hour(s)). Creatinine: Recent Labs    07/24/18 0821  CREATININE 1.06   Baseline Creatinine: 1  Impression/Assessment:  77yo with LUTS with urinary hesitancy  Plan:  The risks/benefits/alternatives to TURP was explained to the patient and he understands and wishes to proceed with surgery  [name] 07/29/2018, 8:01 AM

## 2018-07-29 NOTE — Transfer of Care (Signed)
Immediate Anesthesia Transfer of Care Note  Patient: Brian Cordova  Procedure(s) Performed: TRANSURETHRAL RESECTION OF THE PROSTATE (TURP) (N/A Prostate)  Patient Location: PACU  Anesthesia Type:General  Level of Consciousness: drowsy  Airway & Oxygen Therapy: Patient Spontanous Breathing and Patient connected to nasal cannula oxygen  Post-op Assessment: Report given to RN, Post -op Vital signs reviewed and stable and Patient moving all extremities  Post vital signs: Reviewed and stable  Last Vitals:  Vitals Value Taken Time  BP 155/94 07/29/2018  9:17 AM  Temp    Pulse 66 07/29/2018  9:18 AM  Resp 16 07/29/2018  9:18 AM  SpO2 96 % 07/29/2018  9:18 AM  Vitals shown include unvalidated device data.  Last Pain:  Vitals:   07/29/18 0715  TempSrc: Oral  PainSc: 0-No pain      Patients Stated Pain Goal: 7 (67/34/19 3790)  Complications: No apparent anesthesia complications

## 2018-07-29 NOTE — Discharge Instructions (Signed)
Transurethral Resection of the Prostate, Care After °Refer to this sheet in the next few weeks. These instructions provide you with information about caring for yourself after your procedure. Your health care provider may also give you more specific instructions. Your treatment has been planned according to current medical practices, but problems sometimes occur. Call your health care provider if you have any problems or questions after your procedure. °What can I expect after the procedure? °After the procedure, it is common to have: °· Mild pain in your lower abdomen. °· Soreness or mild discomfort in your penis from having the catheter inserted during the procedure. °· A feeling of urgency when you need to urinate. °· A small amount of blood in your urine. You may notice some small blood clots in your urine. These are normal. ° °Follow these instructions at home: °Medicines ° °· Take over-the-counter and prescription medicines only as told by your health care provider. °· Do not drive or operate heavy machinery while taking prescription pain medicine. °· Do not drive for 24 hours if you received a sedative. °· If you were prescribed antibiotic medicine, take it as told by your health care provider. Do not stop taking the antibiotic even if you start to feel better. °Activity °· Return to your normal activities as told by your health care provider. Ask your health care provider what activities are safe for you. °· Do not lift anything that is heavier than 10 lb (4.5 kg) for 3 weeks after your procedure, or as long as told by your health care provider. °· Avoid intense physical activity for as long as told by your health care provider. °· Walk at least one time every day. This helps to prevent blood clots. You may increase your physical activity gradually as you start to feel better. °Lifestyle °· Do not drink alcohol for as long as told by your health care provider. This is especially important if you are taking  prescription pain medicines. °· Do not engage in sexual activity until your health care provider says that you can do this. °General instructions °· Do not take baths, swim, or use a hot tub until your health care provider approves. °· Drink enough fluid to keep your urine clear or pale yellow. °· Urinate as soon as you feel the need to. Do not try to hold your urine for long periods of time. °· If your health care provider approves, you may take a stool softener for 2-3 weeks to prevent you from straining to have a bowel movement. °· Wear compression stockings as told by your health care provider. These stockings help to prevent blood clots and reduce swelling in your legs. °· Keep all follow-up visits as told by your health care provider. This is important. °Contact a health care provider if: °· You have difficulty urinating. °· You have a fever. °· You have pain that gets worse or does not improve with medicine. °· You have blood in your urine that does not go away after 1 week of resting and drinking more fluids. °· You have swelling in your penis or testicles. °Get help right away if: °· You are unable to urinate. °· You are having more blood clots in your urine instead of fewer. °· You have: °? Large blood clots. °? A lot of blood in your urine. °? Pain in your back or lower abdomen. °? Pain or swelling in your legs. °? Chills and you are shaking. °This information is not intended to   replace advice given to you by your health care provider. Make sure you discuss any questions you have with your health care provider. Document Released: 08/28/2005 Document Revised: 04/30/2016 Document Reviewed: 05/20/2015 Elsevier Interactive Patient Education  2017 [employer]     Indwelling Urinary Catheter Care, Adult Take good care of your catheter to keep it working and to prevent problems. How to wear your catheter Attach your catheter to your leg with tape (adhesive tape) or a leg strap. Make sure it is not  too tight. If you use tape, remove any bits of tape that are already on the catheter. How to wear a drainage bag You should have: A large overnight bag. A small leg bag.  Overnight Bag You may wear the overnight bag at any time. Always keep the bag below the level of your bladder but off the floor. When you sleep, put a clean plastic bag in a wastebasket. Then hang the bag inside the wastebasket. Leg Bag Never wear the leg bag at night. Always wear the leg bag below your knee. Keep the leg bag secure with a leg strap or tape. How to care for your skin Clean the skin around the catheter at least once every day. Shower every day. Do not take baths. Put creams, lotions, or ointments on your genital area only as told by your doctor. Do not use powders, sprays, or lotions on your genital area. How to clean your catheter and your skin Wash your hands with soap and water. Wet a washcloth in warm water and gentle (mild) soap. Use the washcloth to clean the skin where the catheter enters your body. Clean downward and wipe away from the catheter in small circles. Do not wipe toward the catheter. Pat the area dry with a clean towel. Make sure to clean off all soap. How to care for your drainage bags Empty your drainage bag when it is ?- full or at least 2-3 times a day. Replace your drainage bag once a month or sooner if it starts to smell bad or look dirty. Do not clean your drainage bag unless told by your doctor. Emptying a drainage bag  Supplies Needed Rubbing alcohol. Gauze pad or cotton ball. Tape or a leg strap.  Steps Wash your hands with soap and water. Separate (detach) the bag from your leg. Hold the bag over the toilet or a clean container. Keep the bag below your hips and bladder. This stops pee (urine) from going back into the tube. Open the pour spout at the bottom of the bag. Empty the pee into the toilet or container. Do not let the pour spout touch any surface. Put rubbing  alcohol on a gauze pad or cotton ball. Use the gauze pad or cotton ball to clean the pour spout. Close the pour spout. Attach the bag to your leg with tape or a leg strap. Wash your hands.  Changing a drainage bag Supplies Needed Alcohol wipes. A clean drainage bag. Adhesive tape or a leg strap.  Steps Wash your hands with soap and water. Separate the dirty bag from your leg. Pinch the rubber catheter with your fingers so that pee does not spill out. Separate the catheter tube from the drainage tube where these tubes connect (at the connection valve). Do not let the tubes touch any surface. Clean the end of the catheter tube with an alcohol wipe. Use a different alcohol wipe to clean the end of the drainage tube. Connect the catheter tube to  the drainage tube of the clean bag. Attach the new bag to the leg with adhesive tape or a leg strap. Wash your hands.  How to prevent infection and other problems Never pull on your catheter or try to remove it. Pulling can damage tissue in your body. Always wash your hands before and after touching your catheter. If a leg strap gets wet, replace it with a dry one. Drink enough fluids to keep your pee clear or pale yellow, or as told by your doctor. Do not let the drainage bag or tubing touch the floor. Wear cotton underwear. If you are male, wipe from front to back after you poop (have a bowel movement). Check on the catheter often to make sure it works and the tubing is not twisted. Get help if: Your pee is cloudy. Your pee smells unusually bad. Your pee is not draining into the bag. Your tube gets clogged. Your catheter starts to leak. Your bladder feels full. Get help right away if: You have redness, swelling, or pain where the catheter enters your body. You have fluid, pus, or a bad smell coming from the area where the catheter enters your body. The area where the catheter enters your body feels warm. You have a fever. You have  pain in your: Stomach (abdomen). Legs. Lower back. Bladder. You see blood fill the catheter. Your pee is pink or red. You feel sick to your stomach (nauseous). You throw up (vomit). You have chills. Your catheter gets pulled out. This information is not intended to replace advice given to you by your health care provider. Make sure you discuss any questions you have with your health care provider. Document Released: 12/23/2012 Document Revised: 07/26/2016 Document Reviewed: 02/10/2014 Elsevier Interactive Patient Education  [employer].

## 2018-07-30 ENCOUNTER — Encounter (HOSPITAL_COMMUNITY): Payer: Self-pay | Admitting: Urology

## 2018-08-02 ENCOUNTER — Ambulatory Visit (INDEPENDENT_AMBULATORY_CARE_PROVIDER_SITE_OTHER): Payer: Medicare HMO | Admitting: Urology

## 2018-08-02 DIAGNOSIS — N401 Enlarged prostate with lower urinary tract symptoms: Secondary | ICD-10-CM | POA: Diagnosis not present

## 2018-08-07 ENCOUNTER — Ambulatory Visit (INDEPENDENT_AMBULATORY_CARE_PROVIDER_SITE_OTHER): Payer: Medicare HMO | Admitting: Urology

## 2018-08-07 DIAGNOSIS — N401 Enlarged prostate with lower urinary tract symptoms: Secondary | ICD-10-CM

## 2018-09-02 DIAGNOSIS — E782 Mixed hyperlipidemia: Secondary | ICD-10-CM | POA: Diagnosis not present

## 2018-09-02 DIAGNOSIS — F331 Major depressive disorder, recurrent, moderate: Secondary | ICD-10-CM | POA: Diagnosis not present

## 2018-09-02 DIAGNOSIS — I10 Essential (primary) hypertension: Secondary | ICD-10-CM | POA: Diagnosis not present

## 2018-09-02 DIAGNOSIS — Z0001 Encounter for general adult medical examination with abnormal findings: Secondary | ICD-10-CM | POA: Diagnosis not present

## 2018-09-02 DIAGNOSIS — N529 Male erectile dysfunction, unspecified: Secondary | ICD-10-CM | POA: Diagnosis not present

## 2018-09-02 DIAGNOSIS — Z6834 Body mass index (BMI) 34.0-34.9, adult: Secondary | ICD-10-CM | POA: Diagnosis not present

## 2018-09-07 DIAGNOSIS — Z23 Encounter for immunization: Secondary | ICD-10-CM | POA: Diagnosis not present

## 2018-09-07 DIAGNOSIS — Z6834 Body mass index (BMI) 34.0-34.9, adult: Secondary | ICD-10-CM | POA: Diagnosis not present

## 2018-09-07 DIAGNOSIS — E782 Mixed hyperlipidemia: Secondary | ICD-10-CM | POA: Diagnosis not present

## 2018-09-07 DIAGNOSIS — Z0001 Encounter for general adult medical examination with abnormal findings: Secondary | ICD-10-CM | POA: Diagnosis not present

## 2018-09-07 DIAGNOSIS — N529 Male erectile dysfunction, unspecified: Secondary | ICD-10-CM | POA: Diagnosis not present

## 2018-09-07 DIAGNOSIS — N400 Enlarged prostate without lower urinary tract symptoms: Secondary | ICD-10-CM | POA: Diagnosis not present

## 2018-09-07 DIAGNOSIS — Z1212 Encounter for screening for malignant neoplasm of rectum: Secondary | ICD-10-CM | POA: Diagnosis not present

## 2018-09-07 DIAGNOSIS — I10 Essential (primary) hypertension: Secondary | ICD-10-CM | POA: Diagnosis not present

## 2018-09-18 ENCOUNTER — Other Ambulatory Visit (HOSPITAL_COMMUNITY)
Admission: RE | Admit: 2018-09-18 | Discharge: 2018-09-18 | Disposition: A | Discharge status: Home / Self Care | Payer: Medicare HMO | Source: Ambulatory Visit | Attending: Urology | Admitting: Urology

## 2018-09-18 ENCOUNTER — Ambulatory Visit: Payer: Medicare HMO | Admitting: Urology

## 2018-09-18 DIAGNOSIS — N3941 Urge incontinence: Secondary | ICD-10-CM | POA: Insufficient documentation

## 2018-09-20 LAB — URINE CULTURE: CULTURE: NO GROWTH

## 2018-09-25 DIAGNOSIS — Z85828 Personal history of other malignant neoplasm of skin: Secondary | ICD-10-CM | POA: Diagnosis not present

## 2018-09-25 DIAGNOSIS — Z85820 Personal history of malignant melanoma of skin: Secondary | ICD-10-CM | POA: Diagnosis not present

## 2018-09-25 DIAGNOSIS — L821 Other seborrheic keratosis: Secondary | ICD-10-CM | POA: Diagnosis not present

## 2018-09-25 DIAGNOSIS — L570 Actinic keratosis: Secondary | ICD-10-CM | POA: Diagnosis not present

## 2018-10-02 ENCOUNTER — Ambulatory Visit: Payer: Medicare HMO | Admitting: Urology

## 2018-10-02 DIAGNOSIS — N401 Enlarged prostate with lower urinary tract symptoms: Secondary | ICD-10-CM

## 2018-10-02 DIAGNOSIS — N3941 Urge incontinence: Secondary | ICD-10-CM

## 2018-10-10 DIAGNOSIS — N529 Male erectile dysfunction, unspecified: Secondary | ICD-10-CM | POA: Diagnosis not present

## 2018-11-12 DIAGNOSIS — N529 Male erectile dysfunction, unspecified: Secondary | ICD-10-CM | POA: Diagnosis not present

## 2018-11-13 ENCOUNTER — Ambulatory Visit: Payer: Medicare HMO | Admitting: Urology

## 2018-11-13 DIAGNOSIS — N3941 Urge incontinence: Secondary | ICD-10-CM | POA: Diagnosis not present

## 2018-11-13 DIAGNOSIS — N401 Enlarged prostate with lower urinary tract symptoms: Secondary | ICD-10-CM | POA: Diagnosis not present

## 2018-12-12 DIAGNOSIS — N529 Male erectile dysfunction, unspecified: Secondary | ICD-10-CM | POA: Diagnosis not present

## 2019-01-13 DIAGNOSIS — N529 Male erectile dysfunction, unspecified: Secondary | ICD-10-CM | POA: Diagnosis not present

## 2019-02-11 DIAGNOSIS — F331 Major depressive disorder, recurrent, moderate: Secondary | ICD-10-CM | POA: Diagnosis not present

## 2019-02-11 DIAGNOSIS — E782 Mixed hyperlipidemia: Secondary | ICD-10-CM | POA: Diagnosis not present

## 2019-02-11 DIAGNOSIS — N529 Male erectile dysfunction, unspecified: Secondary | ICD-10-CM | POA: Diagnosis not present

## 2019-02-11 DIAGNOSIS — I10 Essential (primary) hypertension: Secondary | ICD-10-CM | POA: Diagnosis not present

## 2019-02-14 DIAGNOSIS — F331 Major depressive disorder, recurrent, moderate: Secondary | ICD-10-CM | POA: Diagnosis not present

## 2019-02-14 DIAGNOSIS — E782 Mixed hyperlipidemia: Secondary | ICD-10-CM | POA: Diagnosis not present

## 2019-02-14 DIAGNOSIS — I10 Essential (primary) hypertension: Secondary | ICD-10-CM | POA: Diagnosis not present

## 2019-02-14 DIAGNOSIS — Z23 Encounter for immunization: Secondary | ICD-10-CM | POA: Diagnosis not present

## 2019-02-14 DIAGNOSIS — N400 Enlarged prostate without lower urinary tract symptoms: Secondary | ICD-10-CM | POA: Diagnosis not present

## 2019-02-14 DIAGNOSIS — N529 Male erectile dysfunction, unspecified: Secondary | ICD-10-CM | POA: Diagnosis not present

## 2019-02-14 DIAGNOSIS — Z6833 Body mass index (BMI) 33.0-33.9, adult: Secondary | ICD-10-CM | POA: Diagnosis not present

## 2019-02-14 DIAGNOSIS — N183 Chronic kidney disease, stage 3 (moderate): Secondary | ICD-10-CM | POA: Diagnosis not present

## 2019-02-19 ENCOUNTER — Ambulatory Visit (INDEPENDENT_AMBULATORY_CARE_PROVIDER_SITE_OTHER): Payer: Medicare HMO | Admitting: Urology

## 2019-02-19 DIAGNOSIS — N3941 Urge incontinence: Secondary | ICD-10-CM

## 2019-02-19 DIAGNOSIS — N401 Enlarged prostate with lower urinary tract symptoms: Secondary | ICD-10-CM

## 2019-03-13 DIAGNOSIS — N529 Male erectile dysfunction, unspecified: Secondary | ICD-10-CM | POA: Diagnosis not present

## 2019-03-24 DIAGNOSIS — L02511 Cutaneous abscess of right hand: Secondary | ICD-10-CM | POA: Diagnosis not present

## 2019-03-24 DIAGNOSIS — L0100 Impetigo, unspecified: Secondary | ICD-10-CM | POA: Diagnosis not present

## 2019-03-24 DIAGNOSIS — L570 Actinic keratosis: Secondary | ICD-10-CM | POA: Diagnosis not present

## 2019-04-14 DIAGNOSIS — N529 Male erectile dysfunction, unspecified: Secondary | ICD-10-CM | POA: Diagnosis not present

## 2019-05-16 DIAGNOSIS — N529 Male erectile dysfunction, unspecified: Secondary | ICD-10-CM | POA: Diagnosis not present

## 2019-06-11 DIAGNOSIS — I10 Essential (primary) hypertension: Secondary | ICD-10-CM | POA: Diagnosis not present

## 2019-06-11 DIAGNOSIS — E782 Mixed hyperlipidemia: Secondary | ICD-10-CM | POA: Diagnosis not present

## 2019-08-13 ENCOUNTER — Ambulatory Visit: Payer: Medicare HMO | Admitting: Urology

## 2019-09-15 DIAGNOSIS — E782 Mixed hyperlipidemia: Secondary | ICD-10-CM | POA: Diagnosis not present

## 2019-09-15 DIAGNOSIS — I10 Essential (primary) hypertension: Secondary | ICD-10-CM | POA: Diagnosis not present

## 2019-09-15 DIAGNOSIS — R7989 Other specified abnormal findings of blood chemistry: Secondary | ICD-10-CM | POA: Diagnosis not present

## 2019-09-15 DIAGNOSIS — F331 Major depressive disorder, recurrent, moderate: Secondary | ICD-10-CM | POA: Diagnosis not present

## 2019-09-15 DIAGNOSIS — R739 Hyperglycemia, unspecified: Secondary | ICD-10-CM | POA: Diagnosis not present

## 2019-09-15 DIAGNOSIS — N183 Chronic kidney disease, stage 3 unspecified: Secondary | ICD-10-CM | POA: Diagnosis not present

## 2019-09-15 DIAGNOSIS — R5382 Chronic fatigue, unspecified: Secondary | ICD-10-CM | POA: Diagnosis not present

## 2019-09-19 DIAGNOSIS — Z0001 Encounter for general adult medical examination with abnormal findings: Secondary | ICD-10-CM | POA: Diagnosis not present

## 2019-09-19 DIAGNOSIS — I10 Essential (primary) hypertension: Secondary | ICD-10-CM | POA: Diagnosis not present

## 2019-09-19 DIAGNOSIS — Z6831 Body mass index (BMI) 31.0-31.9, adult: Secondary | ICD-10-CM | POA: Diagnosis not present

## 2019-09-19 DIAGNOSIS — E782 Mixed hyperlipidemia: Secondary | ICD-10-CM | POA: Diagnosis not present

## 2019-09-19 DIAGNOSIS — Z23 Encounter for immunization: Secondary | ICD-10-CM | POA: Diagnosis not present

## 2019-09-19 DIAGNOSIS — N189 Chronic kidney disease, unspecified: Secondary | ICD-10-CM | POA: Diagnosis not present

## 2019-09-19 DIAGNOSIS — N400 Enlarged prostate without lower urinary tract symptoms: Secondary | ICD-10-CM | POA: Diagnosis not present

## 2019-09-19 DIAGNOSIS — N529 Male erectile dysfunction, unspecified: Secondary | ICD-10-CM | POA: Diagnosis not present

## 2019-09-19 DIAGNOSIS — F331 Major depressive disorder, recurrent, moderate: Secondary | ICD-10-CM | POA: Diagnosis not present

## 2019-09-22 DIAGNOSIS — L570 Actinic keratosis: Secondary | ICD-10-CM | POA: Diagnosis not present

## 2019-10-10 DIAGNOSIS — E7849 Other hyperlipidemia: Secondary | ICD-10-CM | POA: Diagnosis not present

## 2019-10-10 DIAGNOSIS — I10 Essential (primary) hypertension: Secondary | ICD-10-CM | POA: Diagnosis not present

## 2020-01-05 DIAGNOSIS — H6123 Impacted cerumen, bilateral: Secondary | ICD-10-CM | POA: Diagnosis not present

## 2020-01-09 DIAGNOSIS — I10 Essential (primary) hypertension: Secondary | ICD-10-CM | POA: Diagnosis not present

## 2020-01-09 DIAGNOSIS — N400 Enlarged prostate without lower urinary tract symptoms: Secondary | ICD-10-CM | POA: Diagnosis not present

## 2020-01-12 DIAGNOSIS — Z20828 Contact with and (suspected) exposure to other viral communicable diseases: Secondary | ICD-10-CM | POA: Diagnosis not present

## 2020-03-17 DIAGNOSIS — L570 Actinic keratosis: Secondary | ICD-10-CM | POA: Diagnosis not present

## 2020-03-24 ENCOUNTER — Telehealth: Payer: Self-pay | Admitting: Urology

## 2020-03-24 NOTE — Telephone Encounter (Signed)
Pt requests  Refill on Toviaz sent to [facility] mail order pharmacy

## 2020-03-25 ENCOUNTER — Other Ambulatory Visit: Payer: Self-pay

## 2020-03-25 DIAGNOSIS — R739 Hyperglycemia, unspecified: Secondary | ICD-10-CM | POA: Diagnosis not present

## 2020-03-25 DIAGNOSIS — N183 Chronic kidney disease, stage 3 unspecified: Secondary | ICD-10-CM | POA: Diagnosis not present

## 2020-03-25 DIAGNOSIS — E782 Mixed hyperlipidemia: Secondary | ICD-10-CM | POA: Diagnosis not present

## 2020-03-25 DIAGNOSIS — I10 Essential (primary) hypertension: Secondary | ICD-10-CM | POA: Diagnosis not present

## 2020-03-25 DIAGNOSIS — R5382 Chronic fatigue, unspecified: Secondary | ICD-10-CM | POA: Diagnosis not present

## 2020-03-25 DIAGNOSIS — N3281 Overactive bladder: Secondary | ICD-10-CM

## 2020-03-25 MED ORDER — TOVIAZ 8 MG PO TB24: 8.0000 mg | ORAL_TABLET | Freq: Every day | ORAL | 5 refills | Status: DC

## 2020-03-25 MED ORDER — TOVIAZ 8 MG PO TB24: 8.0000 mg | ORAL_TABLET | Freq: Every day | ORAL | 11 refills | Status: DC

## 2020-03-25 NOTE — Telephone Encounter (Signed)
Refill submitted. 

## 2020-04-07 DIAGNOSIS — N400 Enlarged prostate without lower urinary tract symptoms: Secondary | ICD-10-CM | POA: Diagnosis not present

## 2020-04-07 DIAGNOSIS — I10 Essential (primary) hypertension: Secondary | ICD-10-CM | POA: Diagnosis not present

## 2020-04-07 DIAGNOSIS — Z6833 Body mass index (BMI) 33.0-33.9, adult: Secondary | ICD-10-CM | POA: Diagnosis not present

## 2020-04-07 DIAGNOSIS — E782 Mixed hyperlipidemia: Secondary | ICD-10-CM | POA: Diagnosis not present

## 2020-04-07 DIAGNOSIS — N529 Male erectile dysfunction, unspecified: Secondary | ICD-10-CM | POA: Diagnosis not present

## 2020-04-09 DIAGNOSIS — I129 Hypertensive chronic kidney disease with stage 1 through stage 4 chronic kidney disease, or unspecified chronic kidney disease: Secondary | ICD-10-CM | POA: Diagnosis not present

## 2020-04-09 DIAGNOSIS — N183 Chronic kidney disease, stage 3 unspecified: Secondary | ICD-10-CM | POA: Diagnosis not present

## 2020-04-09 DIAGNOSIS — E7849 Other hyperlipidemia: Secondary | ICD-10-CM | POA: Diagnosis not present

## 2020-04-15 DIAGNOSIS — H5200 Hypermetropia, unspecified eye: Secondary | ICD-10-CM | POA: Diagnosis not present

## 2020-05-11 DIAGNOSIS — N183 Chronic kidney disease, stage 3 unspecified: Secondary | ICD-10-CM | POA: Diagnosis not present

## 2020-05-11 DIAGNOSIS — E7849 Other hyperlipidemia: Secondary | ICD-10-CM | POA: Diagnosis not present

## 2020-05-11 DIAGNOSIS — I129 Hypertensive chronic kidney disease with stage 1 through stage 4 chronic kidney disease, or unspecified chronic kidney disease: Secondary | ICD-10-CM | POA: Diagnosis not present

## 2020-06-01 ENCOUNTER — Other Ambulatory Visit: Payer: Self-pay | Admitting: Urology

## 2020-06-01 DIAGNOSIS — N3281 Overactive bladder: Secondary | ICD-10-CM

## 2020-07-10 DIAGNOSIS — N183 Chronic kidney disease, stage 3 unspecified: Secondary | ICD-10-CM | POA: Diagnosis not present

## 2020-07-10 DIAGNOSIS — E7849 Other hyperlipidemia: Secondary | ICD-10-CM | POA: Diagnosis not present

## 2020-07-10 DIAGNOSIS — I129 Hypertensive chronic kidney disease with stage 1 through stage 4 chronic kidney disease, or unspecified chronic kidney disease: Secondary | ICD-10-CM | POA: Diagnosis not present

## 2020-07-17 DIAGNOSIS — Z6832 Body mass index (BMI) 32.0-32.9, adult: Secondary | ICD-10-CM | POA: Diagnosis not present

## 2020-07-17 DIAGNOSIS — R339 Retention of urine, unspecified: Secondary | ICD-10-CM | POA: Diagnosis not present

## 2020-07-17 DIAGNOSIS — R300 Dysuria: Secondary | ICD-10-CM | POA: Diagnosis not present

## 2020-08-10 DIAGNOSIS — E7849 Other hyperlipidemia: Secondary | ICD-10-CM | POA: Diagnosis not present

## 2020-08-10 DIAGNOSIS — N183 Chronic kidney disease, stage 3 unspecified: Secondary | ICD-10-CM | POA: Diagnosis not present

## 2020-08-10 DIAGNOSIS — I129 Hypertensive chronic kidney disease with stage 1 through stage 4 chronic kidney disease, or unspecified chronic kidney disease: Secondary | ICD-10-CM | POA: Diagnosis not present

## 2020-08-27 DIAGNOSIS — Z20822 Contact with and (suspected) exposure to covid-19: Secondary | ICD-10-CM | POA: Diagnosis not present

## 2020-08-28 DIAGNOSIS — Z20828 Contact with and (suspected) exposure to other viral communicable diseases: Secondary | ICD-10-CM | POA: Diagnosis not present

## 2020-08-28 DIAGNOSIS — I10 Essential (primary) hypertension: Secondary | ICD-10-CM | POA: Diagnosis not present

## 2020-09-10 DIAGNOSIS — N183 Chronic kidney disease, stage 3 unspecified: Secondary | ICD-10-CM | POA: Diagnosis not present

## 2020-09-10 DIAGNOSIS — E7849 Other hyperlipidemia: Secondary | ICD-10-CM | POA: Diagnosis not present

## 2020-09-10 DIAGNOSIS — I129 Hypertensive chronic kidney disease with stage 1 through stage 4 chronic kidney disease, or unspecified chronic kidney disease: Secondary | ICD-10-CM | POA: Diagnosis not present

## 2020-09-17 DIAGNOSIS — E782 Mixed hyperlipidemia: Secondary | ICD-10-CM | POA: Diagnosis not present

## 2020-09-17 DIAGNOSIS — R739 Hyperglycemia, unspecified: Secondary | ICD-10-CM | POA: Diagnosis not present

## 2020-09-17 DIAGNOSIS — N183 Chronic kidney disease, stage 3 unspecified: Secondary | ICD-10-CM | POA: Diagnosis not present

## 2020-09-17 DIAGNOSIS — I10 Essential (primary) hypertension: Secondary | ICD-10-CM | POA: Diagnosis not present

## 2020-09-17 DIAGNOSIS — R891 Abnormal level of hormones in specimens from other organs, systems and tissues: Secondary | ICD-10-CM | POA: Diagnosis not present

## 2020-09-17 DIAGNOSIS — Z0001 Encounter for general adult medical examination with abnormal findings: Secondary | ICD-10-CM | POA: Diagnosis not present

## 2020-09-22 DIAGNOSIS — E7849 Other hyperlipidemia: Secondary | ICD-10-CM | POA: Diagnosis not present

## 2020-09-22 DIAGNOSIS — Z23 Encounter for immunization: Secondary | ICD-10-CM | POA: Diagnosis not present

## 2020-09-22 DIAGNOSIS — Z1212 Encounter for screening for malignant neoplasm of rectum: Secondary | ICD-10-CM | POA: Diagnosis not present

## 2020-09-22 DIAGNOSIS — N529 Male erectile dysfunction, unspecified: Secondary | ICD-10-CM | POA: Diagnosis not present

## 2020-09-22 DIAGNOSIS — N400 Enlarged prostate without lower urinary tract symptoms: Secondary | ICD-10-CM | POA: Diagnosis not present

## 2020-09-22 DIAGNOSIS — Z0001 Encounter for general adult medical examination with abnormal findings: Secondary | ICD-10-CM | POA: Diagnosis not present

## 2020-09-22 DIAGNOSIS — E1122 Type 2 diabetes mellitus with diabetic chronic kidney disease: Secondary | ICD-10-CM | POA: Diagnosis not present

## 2020-09-22 DIAGNOSIS — I10 Essential (primary) hypertension: Secondary | ICD-10-CM | POA: Diagnosis not present

## 2020-10-09 DIAGNOSIS — I129 Hypertensive chronic kidney disease with stage 1 through stage 4 chronic kidney disease, or unspecified chronic kidney disease: Secondary | ICD-10-CM | POA: Diagnosis not present

## 2020-10-09 DIAGNOSIS — E7849 Other hyperlipidemia: Secondary | ICD-10-CM | POA: Diagnosis not present

## 2020-10-09 DIAGNOSIS — N183 Chronic kidney disease, stage 3 unspecified: Secondary | ICD-10-CM | POA: Diagnosis not present

## 2020-10-13 DIAGNOSIS — L570 Actinic keratosis: Secondary | ICD-10-CM | POA: Diagnosis not present

## 2020-10-13 DIAGNOSIS — Z85820 Personal history of malignant melanoma of skin: Secondary | ICD-10-CM | POA: Diagnosis not present

## 2020-10-13 DIAGNOSIS — L814 Other melanin hyperpigmentation: Secondary | ICD-10-CM | POA: Diagnosis not present

## 2020-10-13 DIAGNOSIS — Z85828 Personal history of other malignant neoplasm of skin: Secondary | ICD-10-CM | POA: Diagnosis not present

## 2020-12-08 DIAGNOSIS — I129 Hypertensive chronic kidney disease with stage 1 through stage 4 chronic kidney disease, or unspecified chronic kidney disease: Secondary | ICD-10-CM | POA: Diagnosis not present

## 2020-12-08 DIAGNOSIS — E7849 Other hyperlipidemia: Secondary | ICD-10-CM | POA: Diagnosis not present

## 2020-12-08 DIAGNOSIS — N183 Chronic kidney disease, stage 3 unspecified: Secondary | ICD-10-CM | POA: Diagnosis not present

## 2021-01-08 DIAGNOSIS — I129 Hypertensive chronic kidney disease with stage 1 through stage 4 chronic kidney disease, or unspecified chronic kidney disease: Secondary | ICD-10-CM | POA: Diagnosis not present

## 2021-01-08 DIAGNOSIS — N183 Chronic kidney disease, stage 3 unspecified: Secondary | ICD-10-CM | POA: Diagnosis not present

## 2021-01-08 DIAGNOSIS — E7849 Other hyperlipidemia: Secondary | ICD-10-CM | POA: Diagnosis not present

## 2021-02-24 DIAGNOSIS — H6123 Impacted cerumen, bilateral: Secondary | ICD-10-CM | POA: Diagnosis not present

## 2021-03-10 DIAGNOSIS — N183 Chronic kidney disease, stage 3 unspecified: Secondary | ICD-10-CM | POA: Diagnosis not present

## 2021-03-10 DIAGNOSIS — E7849 Other hyperlipidemia: Secondary | ICD-10-CM | POA: Diagnosis not present

## 2021-03-10 DIAGNOSIS — I129 Hypertensive chronic kidney disease with stage 1 through stage 4 chronic kidney disease, or unspecified chronic kidney disease: Secondary | ICD-10-CM | POA: Diagnosis not present

## 2021-03-22 DIAGNOSIS — E782 Mixed hyperlipidemia: Secondary | ICD-10-CM | POA: Diagnosis not present

## 2021-03-22 DIAGNOSIS — E1122 Type 2 diabetes mellitus with diabetic chronic kidney disease: Secondary | ICD-10-CM | POA: Diagnosis not present

## 2021-03-22 DIAGNOSIS — N1831 Chronic kidney disease, stage 3a: Secondary | ICD-10-CM | POA: Diagnosis not present

## 2021-03-22 DIAGNOSIS — R5382 Chronic fatigue, unspecified: Secondary | ICD-10-CM | POA: Diagnosis not present

## 2021-03-22 DIAGNOSIS — E7849 Other hyperlipidemia: Secondary | ICD-10-CM | POA: Diagnosis not present

## 2021-04-05 DIAGNOSIS — N400 Enlarged prostate without lower urinary tract symptoms: Secondary | ICD-10-CM | POA: Diagnosis not present

## 2021-04-05 DIAGNOSIS — Z7189 Other specified counseling: Secondary | ICD-10-CM | POA: Diagnosis not present

## 2021-04-05 DIAGNOSIS — Z6831 Body mass index (BMI) 31.0-31.9, adult: Secondary | ICD-10-CM | POA: Diagnosis not present

## 2021-04-05 DIAGNOSIS — N529 Male erectile dysfunction, unspecified: Secondary | ICD-10-CM | POA: Diagnosis not present

## 2021-04-05 DIAGNOSIS — I10 Essential (primary) hypertension: Secondary | ICD-10-CM | POA: Diagnosis not present

## 2021-04-05 DIAGNOSIS — E7849 Other hyperlipidemia: Secondary | ICD-10-CM | POA: Diagnosis not present

## 2021-04-05 DIAGNOSIS — E1122 Type 2 diabetes mellitus with diabetic chronic kidney disease: Secondary | ICD-10-CM | POA: Diagnosis not present

## 2021-04-10 DIAGNOSIS — N183 Chronic kidney disease, stage 3 unspecified: Secondary | ICD-10-CM | POA: Diagnosis not present

## 2021-04-10 DIAGNOSIS — E7849 Other hyperlipidemia: Secondary | ICD-10-CM | POA: Diagnosis not present

## 2021-04-10 DIAGNOSIS — I129 Hypertensive chronic kidney disease with stage 1 through stage 4 chronic kidney disease, or unspecified chronic kidney disease: Secondary | ICD-10-CM | POA: Diagnosis not present

## 2021-04-11 DIAGNOSIS — Z85828 Personal history of other malignant neoplasm of skin: Secondary | ICD-10-CM | POA: Diagnosis not present

## 2021-04-11 DIAGNOSIS — Z1283 Encounter for screening for malignant neoplasm of skin: Secondary | ICD-10-CM | POA: Diagnosis not present

## 2021-04-11 DIAGNOSIS — Z85820 Personal history of malignant melanoma of skin: Secondary | ICD-10-CM | POA: Diagnosis not present

## 2021-04-11 DIAGNOSIS — L237 Allergic contact dermatitis due to plants, except food: Secondary | ICD-10-CM | POA: Diagnosis not present

## 2021-04-11 DIAGNOSIS — L570 Actinic keratosis: Secondary | ICD-10-CM | POA: Diagnosis not present

## 2021-05-11 DIAGNOSIS — N183 Chronic kidney disease, stage 3 unspecified: Secondary | ICD-10-CM | POA: Diagnosis not present

## 2021-05-11 DIAGNOSIS — E7849 Other hyperlipidemia: Secondary | ICD-10-CM | POA: Diagnosis not present

## 2021-05-11 DIAGNOSIS — I129 Hypertensive chronic kidney disease with stage 1 through stage 4 chronic kidney disease, or unspecified chronic kidney disease: Secondary | ICD-10-CM | POA: Diagnosis not present

## 2021-06-09 ENCOUNTER — Other Ambulatory Visit: Payer: Self-pay | Admitting: Urology

## 2021-06-09 DIAGNOSIS — N3281 Overactive bladder: Secondary | ICD-10-CM

## 2021-08-13 DIAGNOSIS — R519 Headache, unspecified: Secondary | ICD-10-CM | POA: Diagnosis not present

## 2021-09-09 DIAGNOSIS — N183 Chronic kidney disease, stage 3 unspecified: Secondary | ICD-10-CM | POA: Diagnosis not present

## 2021-09-09 DIAGNOSIS — E7849 Other hyperlipidemia: Secondary | ICD-10-CM | POA: Diagnosis not present

## 2021-09-09 DIAGNOSIS — I129 Hypertensive chronic kidney disease with stage 1 through stage 4 chronic kidney disease, or unspecified chronic kidney disease: Secondary | ICD-10-CM | POA: Diagnosis not present

## 2021-09-16 DIAGNOSIS — H6123 Impacted cerumen, bilateral: Secondary | ICD-10-CM | POA: Diagnosis not present

## 2021-10-03 DIAGNOSIS — H5200 Hypermetropia, unspecified eye: Secondary | ICD-10-CM | POA: Diagnosis not present

## 2021-10-10 DIAGNOSIS — L570 Actinic keratosis: Secondary | ICD-10-CM | POA: Diagnosis not present

## 2021-10-10 DIAGNOSIS — D485 Neoplasm of uncertain behavior of skin: Secondary | ICD-10-CM | POA: Diagnosis not present

## 2021-10-10 DIAGNOSIS — D044 Carcinoma in situ of skin of scalp and neck: Secondary | ICD-10-CM | POA: Diagnosis not present

## 2021-10-20 DIAGNOSIS — D044 Carcinoma in situ of skin of scalp and neck: Secondary | ICD-10-CM | POA: Diagnosis not present

## 2022-03-28 DIAGNOSIS — Z85820 Personal history of malignant melanoma of skin: Secondary | ICD-10-CM | POA: Diagnosis not present

## 2022-03-28 DIAGNOSIS — Z85828 Personal history of other malignant neoplasm of skin: Secondary | ICD-10-CM | POA: Diagnosis not present

## 2022-03-28 DIAGNOSIS — L570 Actinic keratosis: Secondary | ICD-10-CM | POA: Diagnosis not present

## 2022-08-06 ENCOUNTER — Other Ambulatory Visit: Payer: Self-pay | Admitting: Urology

## 2022-08-06 DIAGNOSIS — N3281 Overactive bladder: Secondary | ICD-10-CM

## 2022-08-22 DIAGNOSIS — R809 Proteinuria, unspecified: Secondary | ICD-10-CM | POA: Diagnosis not present

## 2022-08-22 DIAGNOSIS — E1122 Type 2 diabetes mellitus with diabetic chronic kidney disease: Secondary | ICD-10-CM | POA: Diagnosis not present

## 2022-08-22 DIAGNOSIS — N1831 Chronic kidney disease, stage 3a: Secondary | ICD-10-CM | POA: Diagnosis not present

## 2022-08-22 DIAGNOSIS — I10 Essential (primary) hypertension: Secondary | ICD-10-CM | POA: Diagnosis not present

## 2022-08-22 DIAGNOSIS — F331 Major depressive disorder, recurrent, moderate: Secondary | ICD-10-CM | POA: Diagnosis not present

## 2022-08-22 DIAGNOSIS — R739 Hyperglycemia, unspecified: Secondary | ICD-10-CM | POA: Diagnosis not present

## 2022-08-22 DIAGNOSIS — E782 Mixed hyperlipidemia: Secondary | ICD-10-CM | POA: Diagnosis not present

## 2022-08-22 DIAGNOSIS — E7849 Other hyperlipidemia: Secondary | ICD-10-CM | POA: Diagnosis not present

## 2022-08-25 DIAGNOSIS — N529 Male erectile dysfunction, unspecified: Secondary | ICD-10-CM | POA: Diagnosis not present

## 2022-08-25 DIAGNOSIS — Z23 Encounter for immunization: Secondary | ICD-10-CM | POA: Diagnosis not present

## 2022-08-25 DIAGNOSIS — E7849 Other hyperlipidemia: Secondary | ICD-10-CM | POA: Diagnosis not present

## 2022-08-25 DIAGNOSIS — N400 Enlarged prostate without lower urinary tract symptoms: Secondary | ICD-10-CM | POA: Diagnosis not present

## 2022-08-25 DIAGNOSIS — R059 Cough, unspecified: Secondary | ICD-10-CM | POA: Diagnosis not present

## 2022-08-25 DIAGNOSIS — E1122 Type 2 diabetes mellitus with diabetic chronic kidney disease: Secondary | ICD-10-CM | POA: Diagnosis not present

## 2022-08-25 DIAGNOSIS — I10 Essential (primary) hypertension: Secondary | ICD-10-CM | POA: Diagnosis not present

## 2022-08-25 DIAGNOSIS — N1831 Chronic kidney disease, stage 3a: Secondary | ICD-10-CM | POA: Diagnosis not present

## 2022-08-25 DIAGNOSIS — Z0001 Encounter for general adult medical examination with abnormal findings: Secondary | ICD-10-CM | POA: Diagnosis not present

## 2022-09-25 DIAGNOSIS — N183 Chronic kidney disease, stage 3 unspecified: Secondary | ICD-10-CM | POA: Diagnosis not present

## 2022-09-25 DIAGNOSIS — I10 Essential (primary) hypertension: Secondary | ICD-10-CM | POA: Diagnosis not present

## 2022-09-25 DIAGNOSIS — E1122 Type 2 diabetes mellitus with diabetic chronic kidney disease: Secondary | ICD-10-CM | POA: Diagnosis not present

## 2022-09-25 DIAGNOSIS — E7849 Other hyperlipidemia: Secondary | ICD-10-CM | POA: Diagnosis not present

## 2022-09-25 DIAGNOSIS — Z0001 Encounter for general adult medical examination with abnormal findings: Secondary | ICD-10-CM | POA: Diagnosis not present

## 2022-09-27 DIAGNOSIS — Z85820 Personal history of malignant melanoma of skin: Secondary | ICD-10-CM | POA: Diagnosis not present

## 2022-09-27 DIAGNOSIS — Z1283 Encounter for screening for malignant neoplasm of skin: Secondary | ICD-10-CM | POA: Diagnosis not present

## 2022-09-27 DIAGNOSIS — L570 Actinic keratosis: Secondary | ICD-10-CM | POA: Diagnosis not present

## 2022-09-27 DIAGNOSIS — D239 Other benign neoplasm of skin, unspecified: Secondary | ICD-10-CM | POA: Diagnosis not present

## 2022-09-27 DIAGNOSIS — Z85828 Personal history of other malignant neoplasm of skin: Secondary | ICD-10-CM | POA: Diagnosis not present

## 2023-02-19 DIAGNOSIS — I10 Essential (primary) hypertension: Secondary | ICD-10-CM | POA: Diagnosis not present

## 2023-02-19 DIAGNOSIS — E7849 Other hyperlipidemia: Secondary | ICD-10-CM | POA: Diagnosis not present

## 2023-02-19 DIAGNOSIS — N1831 Chronic kidney disease, stage 3a: Secondary | ICD-10-CM | POA: Diagnosis not present

## 2023-02-19 DIAGNOSIS — R739 Hyperglycemia, unspecified: Secondary | ICD-10-CM | POA: Diagnosis not present

## 2023-02-19 DIAGNOSIS — E1122 Type 2 diabetes mellitus with diabetic chronic kidney disease: Secondary | ICD-10-CM | POA: Diagnosis not present

## 2023-02-19 DIAGNOSIS — N189 Chronic kidney disease, unspecified: Secondary | ICD-10-CM | POA: Diagnosis not present

## 2023-02-26 DIAGNOSIS — I10 Essential (primary) hypertension: Secondary | ICD-10-CM | POA: Diagnosis not present

## 2023-02-26 DIAGNOSIS — Z6829 Body mass index (BMI) 29.0-29.9, adult: Secondary | ICD-10-CM | POA: Diagnosis not present

## 2023-02-26 DIAGNOSIS — E7849 Other hyperlipidemia: Secondary | ICD-10-CM | POA: Diagnosis not present

## 2023-02-26 DIAGNOSIS — N400 Enlarged prostate without lower urinary tract symptoms: Secondary | ICD-10-CM | POA: Diagnosis not present

## 2023-02-26 DIAGNOSIS — E1122 Type 2 diabetes mellitus with diabetic chronic kidney disease: Secondary | ICD-10-CM | POA: Diagnosis not present

## 2023-02-26 DIAGNOSIS — H6123 Impacted cerumen, bilateral: Secondary | ICD-10-CM | POA: Diagnosis not present

## 2023-02-26 DIAGNOSIS — N1831 Chronic kidney disease, stage 3a: Secondary | ICD-10-CM | POA: Diagnosis not present

## 2023-02-26 DIAGNOSIS — N529 Male erectile dysfunction, unspecified: Secondary | ICD-10-CM | POA: Diagnosis not present

## 2023-02-28 DIAGNOSIS — H5203 Hypermetropia, bilateral: Secondary | ICD-10-CM | POA: Diagnosis not present

## 2023-03-28 DIAGNOSIS — D485 Neoplasm of uncertain behavior of skin: Secondary | ICD-10-CM | POA: Diagnosis not present

## 2023-03-28 DIAGNOSIS — Z85820 Personal history of malignant melanoma of skin: Secondary | ICD-10-CM | POA: Diagnosis not present

## 2023-03-28 DIAGNOSIS — Z85828 Personal history of other malignant neoplasm of skin: Secondary | ICD-10-CM | POA: Diagnosis not present

## 2023-03-28 DIAGNOSIS — Z1283 Encounter for screening for malignant neoplasm of skin: Secondary | ICD-10-CM | POA: Diagnosis not present

## 2023-03-28 DIAGNOSIS — L570 Actinic keratosis: Secondary | ICD-10-CM | POA: Diagnosis not present

## 2023-04-03 DIAGNOSIS — H6123 Impacted cerumen, bilateral: Secondary | ICD-10-CM | POA: Diagnosis not present

## 2023-06-25 DIAGNOSIS — Z6829 Body mass index (BMI) 29.0-29.9, adult: Secondary | ICD-10-CM | POA: Diagnosis not present

## 2023-06-25 DIAGNOSIS — R059 Cough, unspecified: Secondary | ICD-10-CM | POA: Diagnosis not present

## 2023-06-25 DIAGNOSIS — R300 Dysuria: Secondary | ICD-10-CM | POA: Diagnosis not present

## 2023-06-25 DIAGNOSIS — J329 Chronic sinusitis, unspecified: Secondary | ICD-10-CM | POA: Diagnosis not present

## 2023-06-25 DIAGNOSIS — J029 Acute pharyngitis, unspecified: Secondary | ICD-10-CM | POA: Diagnosis not present

## 2023-06-25 DIAGNOSIS — R030 Elevated blood-pressure reading, without diagnosis of hypertension: Secondary | ICD-10-CM | POA: Diagnosis not present

## 2023-06-25 DIAGNOSIS — Z20828 Contact with and (suspected) exposure to other viral communicable diseases: Secondary | ICD-10-CM | POA: Diagnosis not present

## 2023-08-09 ENCOUNTER — Other Ambulatory Visit: Payer: Self-pay | Admitting: Urology

## 2023-08-09 DIAGNOSIS — N3281 Overactive bladder: Secondary | ICD-10-CM

## 2023-09-20 DIAGNOSIS — Z1329 Encounter for screening for other suspected endocrine disorder: Secondary | ICD-10-CM | POA: Diagnosis not present

## 2023-09-20 DIAGNOSIS — N183 Chronic kidney disease, stage 3 unspecified: Secondary | ICD-10-CM | POA: Diagnosis not present

## 2023-09-20 DIAGNOSIS — Z1321 Encounter for screening for nutritional disorder: Secondary | ICD-10-CM | POA: Diagnosis not present

## 2023-09-20 DIAGNOSIS — R739 Hyperglycemia, unspecified: Secondary | ICD-10-CM | POA: Diagnosis not present

## 2023-09-20 DIAGNOSIS — E7849 Other hyperlipidemia: Secondary | ICD-10-CM | POA: Diagnosis not present

## 2023-09-20 DIAGNOSIS — I10 Essential (primary) hypertension: Secondary | ICD-10-CM | POA: Diagnosis not present

## 2023-09-20 DIAGNOSIS — E1122 Type 2 diabetes mellitus with diabetic chronic kidney disease: Secondary | ICD-10-CM | POA: Diagnosis not present

## 2023-09-20 DIAGNOSIS — E782 Mixed hyperlipidemia: Secondary | ICD-10-CM | POA: Diagnosis not present

## 2023-09-20 DIAGNOSIS — Z0001 Encounter for general adult medical examination with abnormal findings: Secondary | ICD-10-CM | POA: Diagnosis not present

## 2023-09-25 DIAGNOSIS — Z85828 Personal history of other malignant neoplasm of skin: Secondary | ICD-10-CM | POA: Diagnosis not present

## 2023-09-25 DIAGNOSIS — Z85820 Personal history of malignant melanoma of skin: Secondary | ICD-10-CM | POA: Diagnosis not present

## 2023-09-25 DIAGNOSIS — L570 Actinic keratosis: Secondary | ICD-10-CM | POA: Diagnosis not present

## 2023-09-27 DIAGNOSIS — E782 Mixed hyperlipidemia: Secondary | ICD-10-CM | POA: Diagnosis not present

## 2023-09-27 DIAGNOSIS — Z23 Encounter for immunization: Secondary | ICD-10-CM | POA: Diagnosis not present

## 2023-09-27 DIAGNOSIS — Z6829 Body mass index (BMI) 29.0-29.9, adult: Secondary | ICD-10-CM | POA: Diagnosis not present

## 2023-09-27 DIAGNOSIS — F331 Major depressive disorder, recurrent, moderate: Secondary | ICD-10-CM | POA: Diagnosis not present

## 2023-09-27 DIAGNOSIS — Z0001 Encounter for general adult medical examination with abnormal findings: Secondary | ICD-10-CM | POA: Diagnosis not present

## 2023-09-27 DIAGNOSIS — I10 Essential (primary) hypertension: Secondary | ICD-10-CM | POA: Diagnosis not present

## 2023-09-27 DIAGNOSIS — N3281 Overactive bladder: Secondary | ICD-10-CM | POA: Diagnosis not present

## 2023-11-20 DIAGNOSIS — Z6829 Body mass index (BMI) 29.0-29.9, adult: Secondary | ICD-10-CM | POA: Diagnosis not present

## 2023-11-20 DIAGNOSIS — L03113 Cellulitis of right upper limb: Secondary | ICD-10-CM | POA: Diagnosis not present

## 2024-01-14 DIAGNOSIS — D485 Neoplasm of uncertain behavior of skin: Secondary | ICD-10-CM | POA: Diagnosis not present

## 2024-01-14 DIAGNOSIS — L820 Inflamed seborrheic keratosis: Secondary | ICD-10-CM | POA: Diagnosis not present

## 2024-03-31 DIAGNOSIS — N3281 Overactive bladder: Secondary | ICD-10-CM | POA: Diagnosis not present

## 2024-03-31 DIAGNOSIS — Z6830 Body mass index (BMI) 30.0-30.9, adult: Secondary | ICD-10-CM | POA: Diagnosis not present

## 2024-03-31 DIAGNOSIS — I10 Essential (primary) hypertension: Secondary | ICD-10-CM | POA: Diagnosis not present

## 2024-03-31 DIAGNOSIS — E7849 Other hyperlipidemia: Secondary | ICD-10-CM | POA: Diagnosis not present

## 2024-03-31 DIAGNOSIS — M542 Cervicalgia: Secondary | ICD-10-CM | POA: Diagnosis not present

## 2024-03-31 DIAGNOSIS — E782 Mixed hyperlipidemia: Secondary | ICD-10-CM | POA: Diagnosis not present

## 2024-05-26 DIAGNOSIS — L570 Actinic keratosis: Secondary | ICD-10-CM | POA: Diagnosis not present

## 2024-05-26 DIAGNOSIS — Z85820 Personal history of malignant melanoma of skin: Secondary | ICD-10-CM | POA: Diagnosis not present

## 2024-05-26 DIAGNOSIS — L089 Local infection of the skin and subcutaneous tissue, unspecified: Secondary | ICD-10-CM | POA: Diagnosis not present

## 2024-05-26 DIAGNOSIS — D692 Other nonthrombocytopenic purpura: Secondary | ICD-10-CM | POA: Diagnosis not present

## 2024-05-26 DIAGNOSIS — D485 Neoplasm of uncertain behavior of skin: Secondary | ICD-10-CM | POA: Diagnosis not present

## 2024-05-26 DIAGNOSIS — I781 Nevus, non-neoplastic: Secondary | ICD-10-CM | POA: Diagnosis not present

## 2024-06-27 DIAGNOSIS — H6123 Impacted cerumen, bilateral: Secondary | ICD-10-CM | POA: Diagnosis not present

## 2024-08-29 ENCOUNTER — Other Ambulatory Visit: Payer: Self-pay | Admitting: Urology

## 2024-08-29 DIAGNOSIS — N3281 Overactive bladder: Secondary | ICD-10-CM
